# Patient Record
Sex: Male | Born: 1995 | Race: White
Health system: Southern US, Community
[De-identification: ages and names within clinical notes are randomized; demographics above are authoritative.]

## PROBLEM LIST (undated history)

## (undated) DIAGNOSIS — G919 Hydrocephalus, unspecified: Secondary | ICD-10-CM

## (undated) DIAGNOSIS — S060XAA Concussion with loss of consciousness status unknown, initial encounter: Secondary | ICD-10-CM

## (undated) DIAGNOSIS — D332 Benign neoplasm of brain, unspecified: Secondary | ICD-10-CM

## (undated) DIAGNOSIS — S060X9A Concussion with loss of consciousness of unspecified duration, initial encounter: Secondary | ICD-10-CM

## (undated) DIAGNOSIS — J302 Other seasonal allergic rhinitis: Secondary | ICD-10-CM

## (undated) HISTORY — DX: Concussion with loss of consciousness status unknown, initial encounter: S06.0XAA

## (undated) HISTORY — DX: Benign neoplasm of brain, unspecified: D33.2

## (undated) HISTORY — DX: Other seasonal allergic rhinitis: J30.2

## (undated) HISTORY — DX: Hydrocephalus, unspecified: G91.9

## (undated) HISTORY — DX: Concussion with loss of consciousness of unspecified duration, initial encounter: S06.0X9A

## (undated) HISTORY — PX: THIRD VENTRICULOSTOMY: SHX2497

## (undated) MED FILL — RIMEGEPANT 75 MG DISINTEGRATING TABLET: 75 mg | ORAL | 30 days supply | Qty: 16 | Fill #2

## (undated) MED FILL — OXYBUTYNIN CHLORIDE ER 5 MG TABLET,EXTENDED RELEASE 24 HR: 5 mg | ORAL | 30 days supply | Qty: 30 | Fill #0

---
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
## 2017-01-17 ENCOUNTER — Telehealth (INDEPENDENT_AMBULATORY_CARE_PROVIDER_SITE_OTHER): Payer: Self-pay

## 2017-01-17 NOTE — Telephone Encounter (Signed)
Patient should be evaluated in clinic before a note can be written.

## 2017-01-17 NOTE — Telephone Encounter (Signed)
Pt called requesting a doctor's note to state that he should have a single room in his dorm at [employer] next fall. [employer] social worker, Thurston Hole, notified and given pt's e-mail address, and message routed to Dr. Evaristo Bury. Let Arman know that we would begin to work on it and he would hear from [place] in the next few days.

## 2017-01-17 NOTE — Telephone Encounter (Signed)
After speaking with Dr. Evaristo Bury, I called pt to notify him to please come in to be seen if he is having frequent headaches to the point that it is requiring special accommodations, that he needs to come in to be evaluate. Unable to hear pt while on the phone, so this information was also sent via e-mail to the address provided in our previous phone call.

## 2017-01-28 ENCOUNTER — Telehealth (INDEPENDENT_AMBULATORY_CARE_PROVIDER_SITE_OTHER): Payer: Self-pay

## 2017-01-28 NOTE — Telephone Encounter (Signed)
[name] called again  In reference to getting a letter for his classes because he is getting headaches in school and he wants a single room.   He asked if social work could just write a note.   I reviewed the notes from his previous calls.  [name] gave no indication that he had received the message from [place].  I called [facility] back and left a message with the same information that [name] left regarding him needing to make an appointment to be evaluated by Dr. Evaristo Bury and the neuro onc team  Before a note can be written.

## 2017-01-31 ENCOUNTER — Telehealth (INDEPENDENT_AMBULATORY_CARE_PROVIDER_SITE_OTHER): Payer: Self-pay

## 2017-01-31 NOTE — Telephone Encounter (Signed)
Pt calling to be put in touch with a social worker to assist with a letter for school d/t increasing headaches and need for a private room. I informed him that it would require physician approval for social work to write that kind of note, and that he needed to be seen by Dr. Evaristo Bury or another provider before it could be written, as I had told him previously when we last spoke. As patient is away at school, he is unable to be seen here in our clinic. I encouraged him to see a provider locally, and reiterated that if he is having severe headaches that are requiring special assistance, he needs to be seen by a provider as soon as possible. Pt continued to request speaking directly with social work regarding this issue - I notified him that our social worker, Thurston Hole, was not in the office today, but that she would be notified. Verbalized understanding and no other requests at this time.    Message routed to [employer], SW.

## 2017-02-21 ENCOUNTER — Ambulatory Visit (INDEPENDENT_AMBULATORY_CARE_PROVIDER_SITE_OTHER): Payer: Self-pay | Admitting: Pediatric Hematology-Oncology

## 2017-06-14 ENCOUNTER — Telehealth (INDEPENDENT_AMBULATORY_CARE_PROVIDER_SITE_OTHER): Payer: Self-pay

## 2017-06-14 NOTE — Telephone Encounter (Signed)
Mom called for results of MRI done 2 days ago, Sunday. I do not see results in Epic. This message routed to Dr. Evaristo Bury and Francesca Jewett NP. I called mom back to let her know I received her message and forwarded it to the providers.

## 2017-06-16 ENCOUNTER — Telehealth (INDEPENDENT_AMBULATORY_CARE_PROVIDER_SITE_OTHER): Payer: Self-pay

## 2017-06-16 NOTE — Telephone Encounter (Signed)
Mom called again looking for MRI results from Sunday.  Dr. August Saucer printed out the report and Mom was informed that there were no changes.  Mom verbalized understanding.

## 2017-07-13 ENCOUNTER — Ambulatory Visit (INDEPENDENT_AMBULATORY_CARE_PROVIDER_SITE_OTHER): Payer: No Typology Code available for payment source | Admitting: Pediatric Hematology-Oncology

## 2017-07-13 VITALS — BP 137/82 | HR 92 | Temp 97.7°F | Resp 20 | Ht 68.5 in | Wt 165.1 lb

## 2017-07-13 DIAGNOSIS — D332 Benign neoplasm of brain, unspecified: Secondary | ICD-10-CM

## 2017-07-13 DIAGNOSIS — R51 Headache: Secondary | ICD-10-CM

## 2017-07-13 DIAGNOSIS — G8929 Other chronic pain: Secondary | ICD-10-CM

## 2017-07-13 DIAGNOSIS — M5441 Lumbago with sciatica, right side: Secondary | ICD-10-CM

## 2017-07-13 DIAGNOSIS — M5442 Lumbago with sciatica, left side: Secondary | ICD-10-CM

## 2017-07-13 NOTE — Patient Instructions (Signed)
Brain MRI in 1 year  Spine MRI ordered  Call with concerns    [facility] is pleased to inform you that you can now receive convenient appointment reminders through text messaging, in place of phone calls.     If you would like to participate, text PSV to the number [phone].    Prescription refills, questions about lab results or discussions of your child's care may be addressed during regular business hours, Monday-Friday 8:30-4:30.    Appointment hours: Monday-Friday 8:30AM-4:00PM.  Call if you are going to be late or need to cancel an appointment.    To make an appointment: Call [phone] and press option 3. If your child is sick and requires immediate attention, press option 2. Do not leave a message after hours on the sick line.    After hour Emergencies: : Call [phone]. The message states that the office is closed , then press option 6.The call is transferred to an [facility]. Please ask for the on-call Pediatric Heme/Onc physician. This operator will take your number and have the on-call doctor call back. If needed, the operator may be called directly at [phone].    For questions or concerns during office hours: Call [phone] and press option 4. Leave a message with the spelling of your child's name, date of birth and question.  All messages left after business hours will be answered the next business day. All calls are returned within 24 hours on business days only.     Any request for medication refills outside your child's scheduled appointment will require 48-business hours to be filled.

## 2017-07-14 ENCOUNTER — Encounter (INDEPENDENT_AMBULATORY_CARE_PROVIDER_SITE_OTHER): Payer: Self-pay | Admitting: Pediatric Hematology-Oncology

## 2017-07-14 DIAGNOSIS — M5441 Lumbago with sciatica, right side: Secondary | ICD-10-CM | POA: Insufficient documentation

## 2017-07-14 NOTE — Progress Notes (Signed)
[facility]   Hematology/Oncology Outpatient Return Visit    Date: 07/14/2017  Name: [name],[name]  Diagnosis: (presumptive) tectal glioma      Chief Complaint/Reason for Visit:     Chief Complaint   Patient presents with   . Follow-up         Subjective/Interval History:   Ediberto [name]) is a 21 y.o. 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    Since his last visit, [name] has had multiple medical issues including abdominal pain and cramping. He is being worked up by GI for possible gluten sensitivity.He underwent endoscopy last week. He has had significant low back pain. He has had back pain for multiple years, but it has worsened over the past 6 months. It is lower back, with numbness of his legs at times. Sometimes heat will help his back pain. He was seen by an orthopedist and plain films were negative. No bowel or bladder incontinence.   He did pass a small kidney stone a few weeks ago but the urologist did not feel that the back pain fit with that small of a stone.   He continues to have headaches a few times per week. They are no worse than previous.He usually drinks some caffeine and take ibuprofen for them. No vision changes. No seizures. He also feels that the headaches are worse when his allergies are bothering him.   He has been seen by ophthalmology in the last week and everything was fine.     He finished his freshman year at [employer] and did very well.     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 He previously had frequent daily headaches, and two severe headaches associated with emesis. Underwent endoscopic third ventriculostomy at [facility] on 12/27/12. Biopsy of the lesion was not performed at that time. He subsequently presented with recurrence of headaches without evidence of worsening hydrocephalus. Repeat imaging in April 2014 revealed  possible disease progression, prompting stereotactic biopsy on 04/09/13. Biopsy was non-diagnostic. Lesion has been stable on repeat imaging.    Review of Systems:   A complete review of systems was performed and is negative except for the items mentioned above.      Medications:     No outpatient prescriptions have been marked as taking for the 07/13/17 encounter (Office Visit) with Eddie North, MD.         Allergies:     Allergies   Allergen Reactions   . Peanut-Containing Drug Products    . Tree Nuts          Past Medical/Surgical/Family/Social History:   I personally reviewed and updated the past medical and past surgical histories.    Performance Status:     100% = Normal no complaints; no evidence of disease.    Physical Exam:     Vitals:    07/13/17 0830   BP: 137/82   Pulse: 92   Resp: 20   Temp: 97.7 F (36.5 C)   TempSrc: Oral   SpO2: 100%   Weight: 74.9 kg   Height: 1.74 m     General appearance - alert, well appearing, and in no distress  Eyes - pupils equal and reactive, extraocular eye movements intact  Ears - bilateral TM's and external ear canals normal  Nose - normal and patent, no erythema, discharge or polyps  Mouth - mucous membranes moist, pharynx normal  without lesions  Neck - supple, no significant adenopathy  Lymphatics - no palpable lymphadenopathy, no hepatosplenomegaly  Chest - clear to auscultation, no wheezes, rales or rhonchi, symmetric air entry  Heart - normal rate, regular rhythm, normal S1, S2, no murmurs, rubs, clicks or gallops  Abdomen - soft, nontender, nondistended, no masses or organomegaly, +BS  Musculoskeletal - moving all four extremities; no tenderness to palpation over his back   Extremities - peripheral pulses normal, cap refill <2 sec  Skin - normal coloration and turgor, no rashes, no suspicious skin lesions noted  Neurological - Cranial nerve II: visual fields intact  CN III, IV, VI: EOMI Pupils equal and reactive to light  CN V: facial sensation intact  CN  VII: Symmetric facies  CN VIII: Hearing intact  CN IX, X, XII: Tongue midline, uvula midline, normal articulation, normal swallow  CN XI: Shoulder shrug intact and symmetrical  Sensation: grossly intact   Reflexes: 2+ patellar   Gait: No ataxia  Normal Romberg   Strength: 5/5 B/L UE and LE       Labs:     Results     ** No results found for the last 24 hours. **           Radiology:     MRI brain reviewed and is stable.     Assessment:   Dillin [name]) is a 21 y.o. 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  Underwent stereotactic biopsy in April 2014, pathology results were indeterminate.     Stable headaches  New worsening back pain with some numbness of legs - concerning for spinal cord/nerve compression/irritation.   Most recent MRI shows continued stable disease with no evidence of hydrocephalus   Elevated BP -     Plan:   Reviewed MRI scan - no changes to brain MRI  Recommended MRI spine - ordered   Discussed management of headaches  Recommended having BP checked at other offices to ensure that it is WNL    MRI brain in 1 year with follow-up    Eddie North, MD        Time spent: I personally spent 30 minutes with [name] and his father, greater than 50% of which was spent in counseling and/or coordination of care discussing back pain, MRI review, recommended follow-up.

## 2017-07-25 ENCOUNTER — Ambulatory Visit: Payer: No Typology Code available for payment source | Attending: Pediatric Hematology-Oncology

## 2017-07-25 DIAGNOSIS — M5441 Lumbago with sciatica, right side: Secondary | ICD-10-CM | POA: Insufficient documentation

## 2017-07-25 DIAGNOSIS — M5442 Lumbago with sciatica, left side: Secondary | ICD-10-CM | POA: Insufficient documentation

## 2017-07-25 DIAGNOSIS — R200 Anesthesia of skin: Secondary | ICD-10-CM | POA: Insufficient documentation

## 2017-07-25 DIAGNOSIS — M519 Unspecified thoracic, thoracolumbar and lumbosacral intervertebral disc disorder: Secondary | ICD-10-CM | POA: Insufficient documentation

## 2017-07-25 DIAGNOSIS — G919 Hydrocephalus, unspecified: Secondary | ICD-10-CM | POA: Insufficient documentation

## 2017-07-25 MED ORDER — GADOBUTROL 1 MMOL/ML IV SOLN
7.5000 mL | Freq: Once | INTRAVENOUS | Status: AC | PRN
Start: 2017-07-25 — End: 2017-07-25
  Administered 2017-07-25: 19:00:00 7.5 mmol via INTRAVENOUS
  Filled 2017-07-25: qty 7.5

## 2017-07-26 ENCOUNTER — Telehealth (INDEPENDENT_AMBULATORY_CARE_PROVIDER_SITE_OTHER): Payer: Self-pay

## 2017-07-26 NOTE — Telephone Encounter (Signed)
Please let [name] know that MRI showed no evidence of tumor in his spine however it did show stable mild disk disease which is likely causing his back pain. Recommend eval by PT

## 2017-07-26 NOTE — Telephone Encounter (Signed)
Per Mckinley Jewel, NP request, RN called and notified mother that MRI showed no evidence of tumor in [name]'s spine and that the only thing of note was mild disc disease which is likely causing his back pain. RN informed mother that Mckinley Jewel, NP recommends evaluation by PT. Mother verbalized understanding.

## 2017-07-26 NOTE — Telephone Encounter (Signed)
An left a Engineer, technical sales on the nursing phone line. He reports that Dr. August Saucer asked him to call the clinic after he got his MRI of the spine. Patient calling to report that he got the MRI of his spine yesterday at [facility]. Patient stated no need to call back, just wanted to let Dr. August Saucer know as she instructed.    Message routed to Dr. Norva Riffle & Mckinley Jewel, NP.

## 2017-10-12 ENCOUNTER — Telehealth (INDEPENDENT_AMBULATORY_CARE_PROVIDER_SITE_OTHER): Payer: Self-pay

## 2017-10-12 NOTE — Telephone Encounter (Signed)
Pt called for recommendation for neurologist in [place] d/t recent headaches. 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 Per Dr. August Saucer, pt should get a PCP in the area, who can then refer him to a neurologist. I called pt back and let him know, and he verbalized understanding. I also suggested he check with the on-campus clinic for evaluation and a referral.

## 2017-10-17 ENCOUNTER — Telehealth (INDEPENDENT_AMBULATORY_CARE_PROVIDER_SITE_OTHER): Payer: Self-pay

## 2017-10-17 NOTE — Telephone Encounter (Signed)
[name] called and left message requesting name of neurologist to evaluate him for headaches. Spoke with [name] and reiterated per conversation last week with RN that he should go ot his PCP for referral. Explained that PCP does have neurologists but that PCP may have specific recommendations. [name] verbalized understanding to plan.

## 2017-11-08 ENCOUNTER — Encounter (INDEPENDENT_AMBULATORY_CARE_PROVIDER_SITE_OTHER): Payer: Self-pay

## 2018-06-28 ENCOUNTER — Ambulatory Visit: Payer: No Typology Code available for payment source | Admitting: Pediatric Hematology-Oncology

## 2018-06-28 VITALS — BP 116/70 | HR 56 | Temp 98.2°F | Resp 18 | Ht 68.94 in | Wt 168.0 lb

## 2018-06-28 DIAGNOSIS — D496 Neoplasm of unspecified behavior of brain: Secondary | ICD-10-CM

## 2018-06-28 DIAGNOSIS — G4489 Other headache syndrome: Secondary | ICD-10-CM

## 2018-06-28 NOTE — Patient Instructions (Signed)
Next MRI in December  Ophthalmology     Call with concerns    [facility] is pleased to inform you that you can now receive convenient appointment reminders through text messaging, in place of phone calls.     If you would like to participate, text PSV to the number [phone].    Prescription refills, questions about lab results or discussions of your child's care may be addressed during regular business hours, Monday-Friday 8:30-4:30.    Please note our new phone number and address:  [address]  [place], [place] [address]  Phone: [phone]  Fax: [phone]    Appointment hours: Monday-Friday 8:30AM-4:00PM.  Call if you are going to be late or need to cancel an appointment.    To make an appointment: Call [phone] and press option 3. If your child is sick and requires immediate attention, press option 2. Do not leave a message after hours on the sick line.    After hour Emergencies: : Call [phone]. The message states that the office is closed , then press option 6.The call is transferred to an [facility]. Please ask for the on-call Pediatric Heme/Onc physician. This operator will take your number and have the on-call doctor call back. If needed, the operator may be called directly at [phone].    For questions or concerns during office hours: Call [phone] message with the spelling of your child's name, date of birth and question.  All messages left after business hours will be answered the next business day. All calls are returned within 24 hours on business days only.     Any request for medication refills outside your child's scheduled appointment will require 48-business hours to be filled.

## 2018-06-28 NOTE — Progress Notes (Signed)
[facility]   Hematology/Oncology Outpatient Return Visit    Date: 07/04/2018  Name: [name],[name]  Diagnosis: (presumptive) tectal glioma      Chief Complaint/Reason for Visit:     Chief Complaint   Patient presents with   . Follow-up         Subjective/Interval History:   [name]) is a 22 y.o. 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    Since his last visit, [name] has had multiple medical issues with severe headaches which necessitated him not completing his fall semester at [employer]. He was having severe daily headaches. He was seen by a neurologist at [facility]. An MRI was stable. He was started on Propranolol and his headaches have improved.   He enrolled at [employer] for the spring semester and plans to return to [employer] in the fall.     Diagnostic/Treatment history:   Treatment History  Diagnosis: tectal glioma (dx 12/24/2012)  Initial Presentation: frequent daily headaches, and two severe headaches associated with emesis.  Site of Disease: tectal lesion   Surgery: Endoscopic third ventriculostomy at [facility] on 12/27/12.  Stereotactic biopsy at [facility] on 04/09/13 after concern for disease progression  Biopsy was non-diagnostic    Radiation Therapy: none   Chemotherapy: none  Central Line: none  Shunts: none  Enteral Tubes: none     Neurocognitive testing: none    Review of Systems:   A complete review of systems was performed and is negative except for the items mentioned above.      Medications:     Outpatient Prescriptions Marked as Taking for the 06/28/18 encounter (Office Visit) with Eddie North, MD   Medication Sig Dispense Refill   . propranolol (INDERAL LA) 120 MG 24 hr capsule Take 120 mg by mouth daily           Allergies:     Allergies   Allergen Reactions   . Peanut-Containing Drug Products    . Tree Nuts          Past Medical/Surgical/Family/Social History:   I personally reviewed and updated the past medical and  past surgical histories.    Performance Status:     100% = Normal no complaints; no evidence of disease.    Physical Exam:     Vitals:    06/28/18 0836   BP: 116/70   Pulse: (!) 56   Resp: 18   Temp: 98.2 F (36.8 C)   TempSrc: Oral   SpO2: 99%   Weight: 76.2 kg   Height: 1.751 m     General appearance - alert, well appearing, and in no distress  Eyes - pupils equal and reactive, extraocular eye movements intact  Ears - bilateral TM's and external ear canals normal  Nose - normal and patent, no erythema, discharge or polyps  Mouth - mucous membranes moist, pharynx normal without lesions  Neck - supple, no significant adenopathy  Lymphatics - no palpable lymphadenopathy, no hepatosplenomegaly  Chest - clear to auscultation, no wheezes, rales or rhonchi, symmetric air entry  Heart - normal rate, regular rhythm, normal S1, S2, no murmurs, rubs, clicks or gallops  Abdomen - soft, nontender, nondistended, no masses or organomegaly, +BS  Musculoskeletal - moving all four extremities; no tenderness to palpation over his back   Extremities - peripheral pulses normal, cap refill <2 sec  Skin - normal coloration and turgor, no rashes, no suspicious skin lesions noted  Neurological - Cranial  nerve II: visual fields intact  CN III, IV, VI: EOMI Pupils equal and reactive to light  CN V: facial sensation intact  CN VII: Symmetric facies  CN VIII: Hearing intact  CN IX, X, XII: Tongue midline, uvula midline, normal articulation, normal swallow  CN XI: Shoulder shrug intact and symmetrical  Sensation: grossly intact   Reflexes: 2+ patellar   Gait: No ataxia  Normal Romberg   Strength: 5/5 B/L UE and LE       Labs:     Results     ** No results found for the last 24 hours. **           Radiology:     MRI brain reviewed and is stable.     Assessment:   [name]) is a 22 y.o. 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  Underwent stereotactic biopsy in April 2014, pathology results were indeterminate.     Improving headaches on  propranolol  Back pain has mostly resolved  Most recent MRI shows continued stable disease with no evidence of hydrocephalus       Plan:   Reviewed MRI scan - no changes to brain MRI  Repeat brain MRI in December    Discussed management of headaches  Recommended follow-up with ophthalmology  Recommended having BP checked at other offices to ensure that it is WNL    MRI brain in 1 year with follow-up    Eddie North, MD        Time spent: I personally spent 30 minutes with [name] greater than 50% of which was spent in counseling and/or coordination of care discussing headaches,  MRI review, recommended follow-up.

## 2018-06-29 ENCOUNTER — Telehealth: Payer: Self-pay

## 2018-07-04 ENCOUNTER — Encounter: Payer: Self-pay | Admitting: Pediatric Hematology-Oncology

## 2018-07-26 ENCOUNTER — Telehealth: Payer: Self-pay

## 2018-07-26 ENCOUNTER — Encounter: Payer: Self-pay | Admitting: Pediatric Hematology-Oncology

## 2018-07-26 NOTE — Telephone Encounter (Signed)
[name] called the nursing phone line and sent the following patient advice request:    "Dear Dr. August Saucer,     I hope you are doing well. Since I last saw you, I have had a pretty constant headache along with ear pain. I saw my neurologist, Dr. Robley Fries, on Friday and she explained it is most likely a migraine. I am a little concerned because after I saw her on Saturday, I had an episode that is similar to what I had when I had hydrocephalus. The episode consisted of a severe headache, nausea, vomiting, and painful vision. Is it possible that the ETV would close up since November (the last time I had an MRI)? If so, should I move up my MRI so we can make sure it has not close up? Please let me know.     Best,   [name] "    Per [name], he already discussed his new symptoms with his neurologist and they believe it is migraines. [name] is very concerned and thinks these symptoms feel like hydrocephalus. He would like to have an MRI prior to going back to school in 2 weeks instead of waiting until December. RN discussed with Doc of the Day, Dr. Mickey Farber. Per Dr. Threasa Beards, ok to schedule MRI in the next two weeks. RN discussed with Brion Aliment, and [place] to schedule and notify patient. RN called patient back and notified him that he will be hearing from [place] regarding scheduling a scan. Patient verbalized understanding.      Routed to Dr. Norva Riffle & Willeen Niece, RN

## 2018-08-02 ENCOUNTER — Ambulatory Visit: Payer: No Typology Code available for payment source

## 2018-08-03 ENCOUNTER — Ambulatory Visit: Payer: No Typology Code available for payment source | Attending: Pediatric Hematology-Oncology

## 2018-08-03 DIAGNOSIS — D496 Neoplasm of unspecified behavior of brain: Secondary | ICD-10-CM | POA: Insufficient documentation

## 2018-08-03 DIAGNOSIS — G939 Disorder of brain, unspecified: Secondary | ICD-10-CM | POA: Insufficient documentation

## 2018-08-03 MED ORDER — GADOBUTROL 1 MMOL/ML IV SOLN
8.0000 mL | Freq: Once | INTRAVENOUS | Status: AC | PRN
Start: 2018-08-03 — End: 2018-08-03
  Administered 2018-08-03: 22:00:00 8 mmol via INTRAVENOUS
  Filled 2018-08-03: qty 10

## 2018-08-07 ENCOUNTER — Telehealth: Payer: Self-pay

## 2018-08-07 NOTE — Telephone Encounter (Signed)
Mother called, left message  requesting results of MRI done 08/03/18.   Results available - Impression:  No significant interval change. Unchanged expansile T2 and FLAIR  hyperintense lesion involving the right superior colliculus with unchanged  6 mm focus of enhancement.    Returned call to mother and informed her that MRI is stable. Mother indicates understanding.  Glynn Octave RN

## 2018-11-24 ENCOUNTER — Other Ambulatory Visit: Payer: Self-pay | Admitting: Pediatric Hematology-Oncology

## 2018-11-24 DIAGNOSIS — D496 Neoplasm of unspecified behavior of brain: Secondary | ICD-10-CM

## 2019-01-23 IMAGING — US US Testicular
1 series · 13 of 16 positions shown · non-contrast
Comparison: None.

US Testicular
INDICATION: Swelling.                                                                    
 Pertinent History: Patient states that for the past 3 months he has had a                 
 frequent urge to urinate but is unable to go all of the time. Within the last             
 week he has noticed discomfort within his left testicle.
TECHNIQUE: Ultrasound of the scrotum including grayscale, color Doppler, and              
 spectral evaluation of the testicles and epididymides was performed.                      
 Comments: None.                                                                           
 Limitations: None.

[Series 1: us testicular · 13 of 54 slices shown]
[im 1/54]
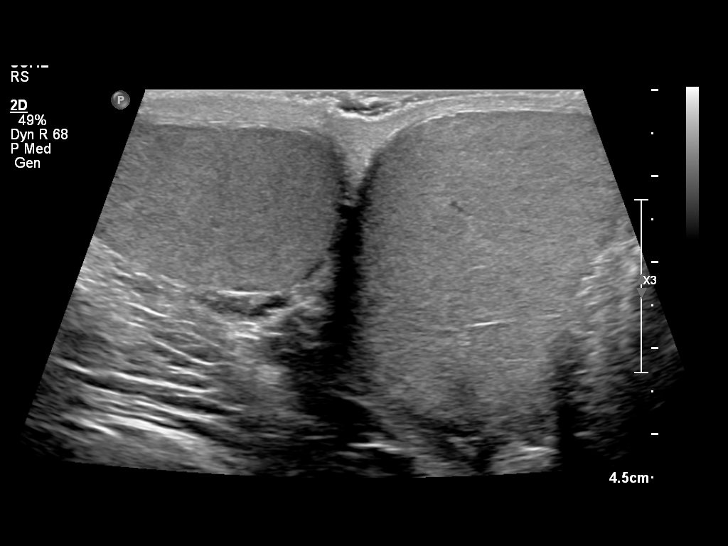
[im 4/54]
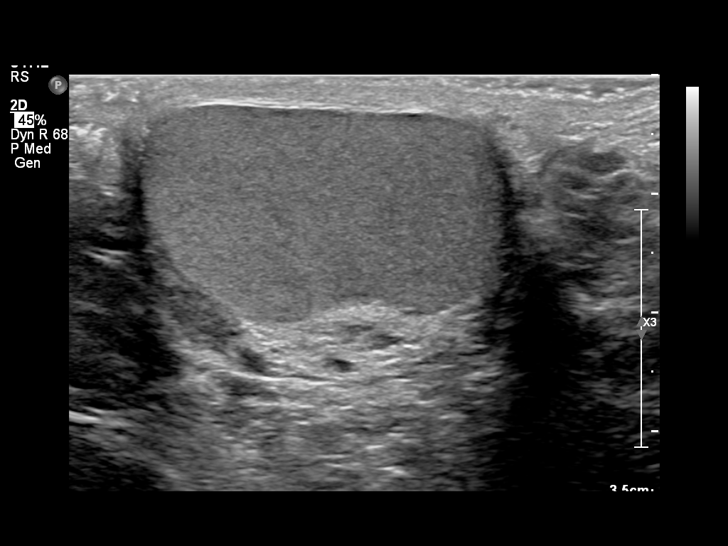
[im 11/54]
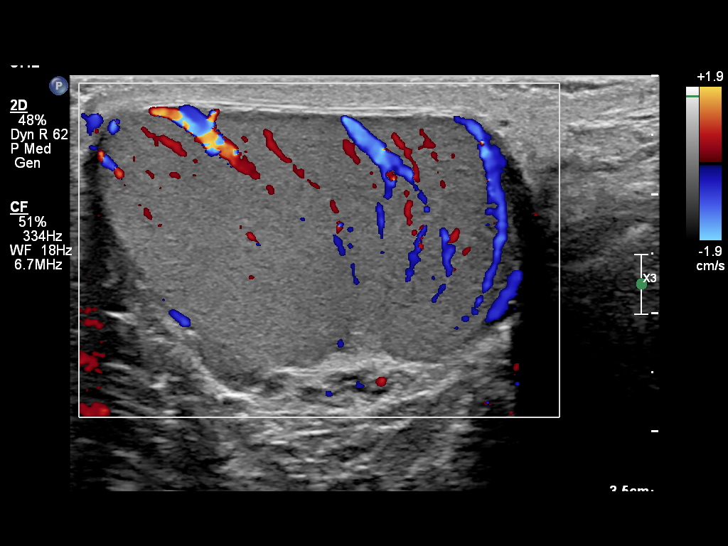
[im 15/54]
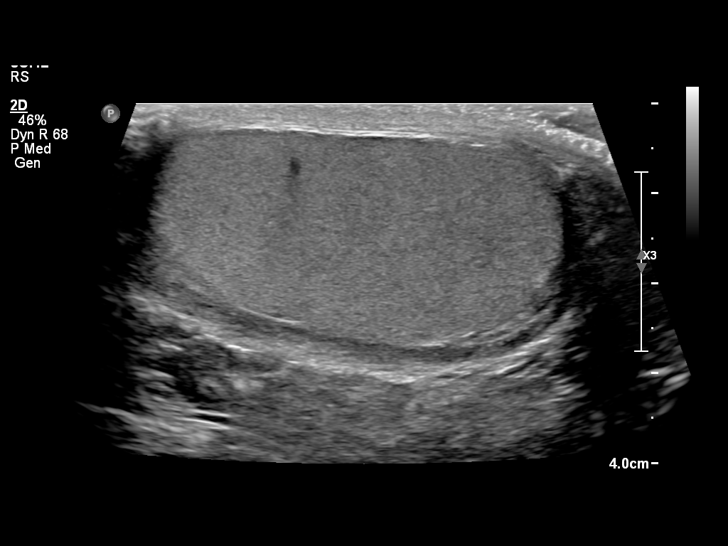
[im 18/54]
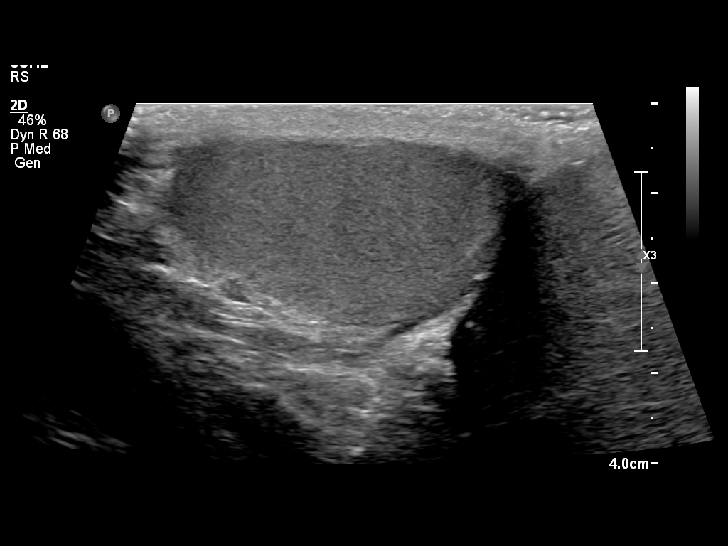
[im 22/54]
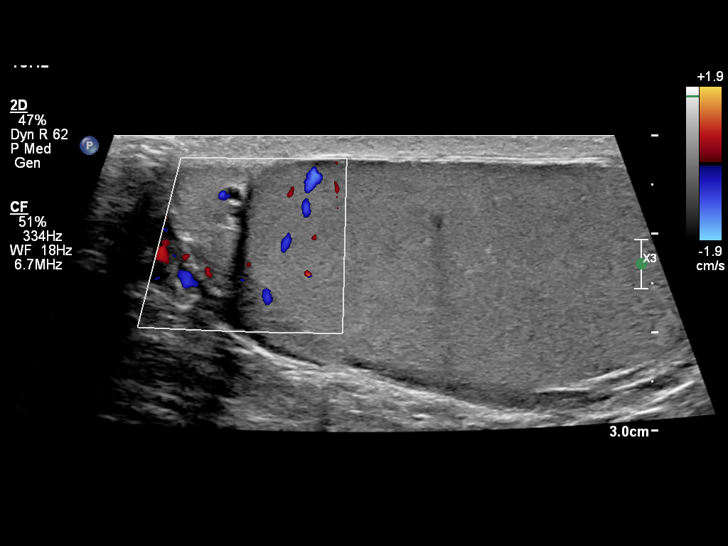
[im 29/54]
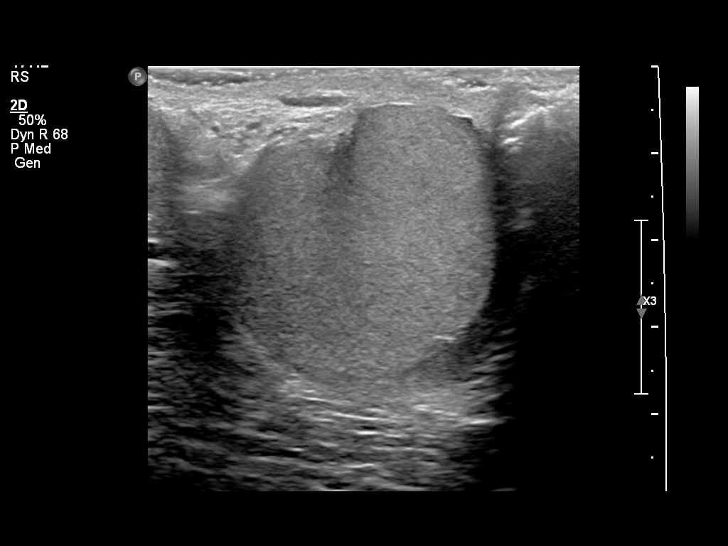
[im 32/54]
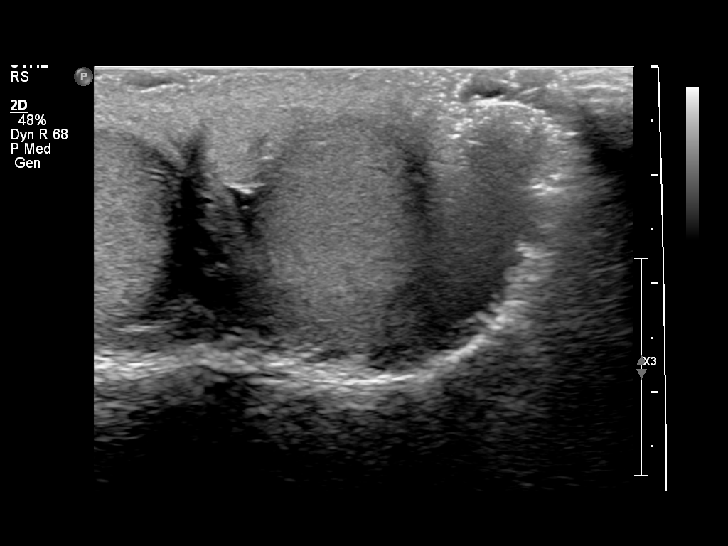
[im 36/54]
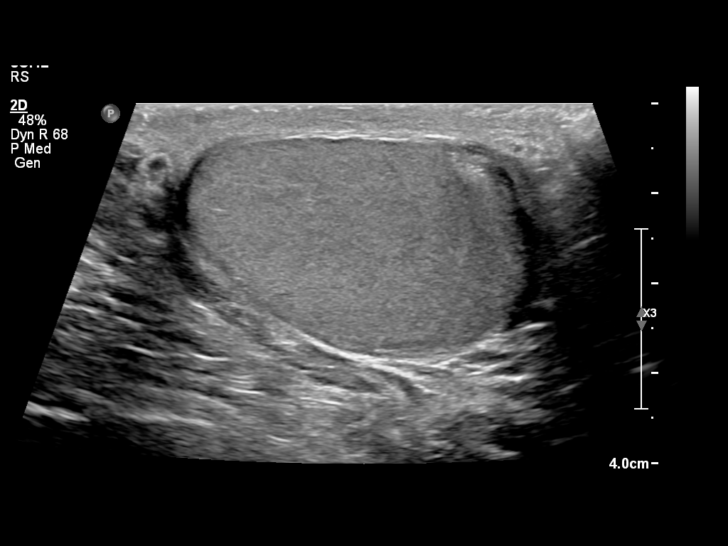
[im 39/54]
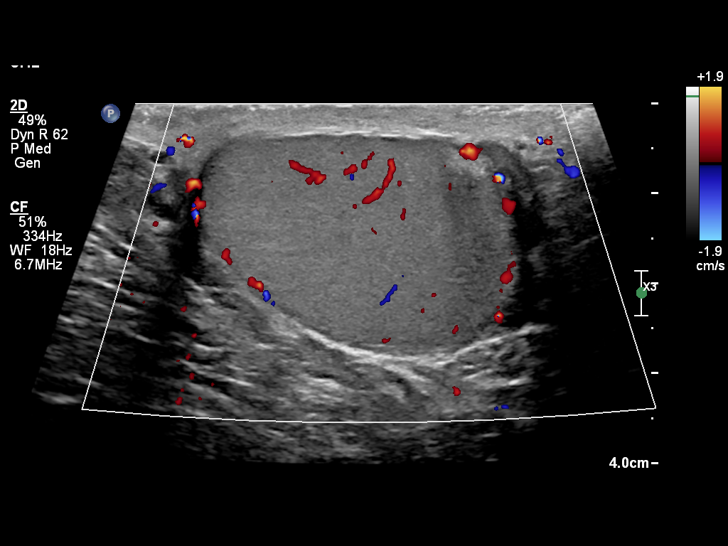
[im 43/54]
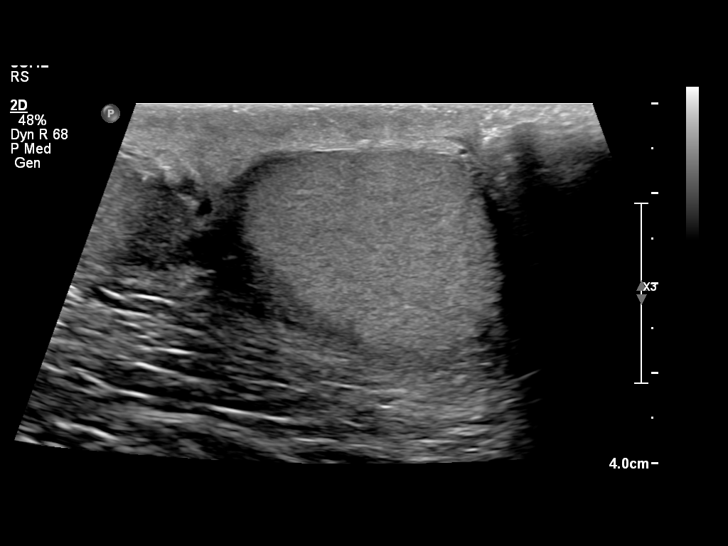
[im 50/54]
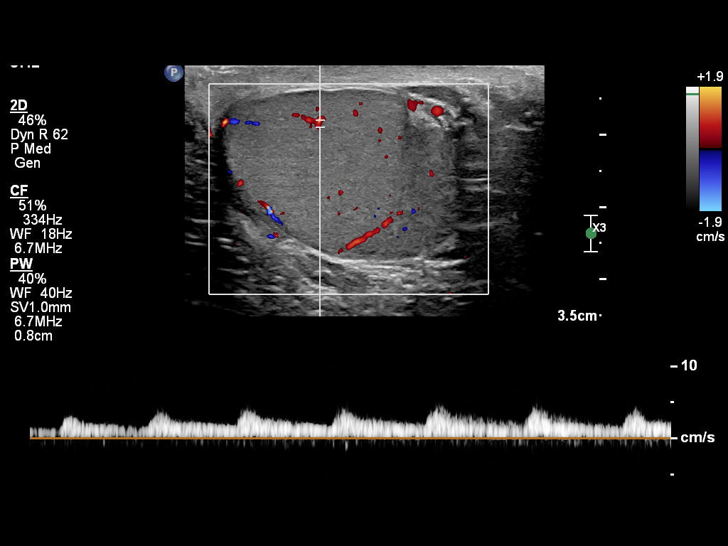
[im 54/54]
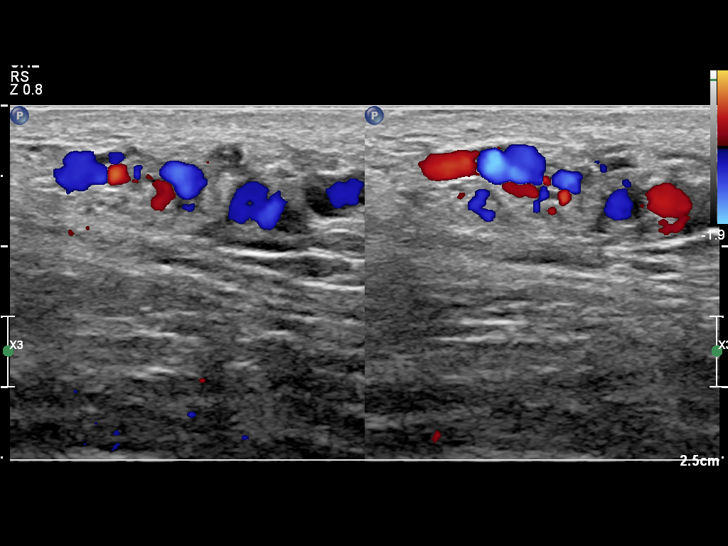

[13 of 16 positions shown; findings below may reference images not displayed]

FINDINGS: RIGHT                                                                                     
 Testicle size: 4.9 x 2.4 x 3.4cm.                                                         
 Appearance: Normal appearance without focal lesions.                                      
 Doppler: Normal spectral and color Doppler arterial inflow and venous                     
 outflow.                                                                                  
 Epididymis: Normal.                                                                       
 Hydrocele: None.                                                                          
 Varicocele: Absent.                                                                       
 LEFT                                                                                      
 Testicle size: 3.9 x 2.5 x 3.0 cm.                                                        
 Appearance: Normal appearance without focal lesions.                                      
 Doppler: Normal spectral and color Doppler arterial inflow and venous                     
 outflow.                                                                                  
 Epididymis: Normal.                                                                       
 Hydrocele: None.                                                                          
 Varicocele: Absent.                                                                       
 Additional: Grossly normal appearance of the scrotum.
IMPRESSION: 1.  . Unremarkable testicular ultrasound.

## 2019-03-29 ENCOUNTER — Other Ambulatory Visit: Payer: Self-pay | Admitting: Pediatrics

## 2019-03-29 DIAGNOSIS — D496 Neoplasm of unspecified behavior of brain: Secondary | ICD-10-CM

## 2019-05-16 ENCOUNTER — Other Ambulatory Visit: Payer: Self-pay | Admitting: Internal Medicine

## 2019-06-13 ENCOUNTER — Telehealth: Payer: Self-pay

## 2019-06-13 NOTE — Telephone Encounter (Signed)
Called patient because he needs to get tested for covid prior to his scans on 7/7. LVM to call back on vonage.

## 2019-06-14 ENCOUNTER — Telehealth: Payer: Self-pay

## 2019-06-14 NOTE — Telephone Encounter (Signed)
Patient returned phone call made earlier this week. He will be tested for covid on 7/5 at [facility] in [place] for his surgery scheduled on 7/9 with ENT. Patient would like to move scans to another day so that he could have time to get results from urgent care. Spoke to [employer] and covid list that was e-mail was incorrect patient scans are not until 8/10 with [facility]. Patient was then contacted again to inform them about the new infomation. Pt will not be able to make it to apt because he will be at school patient will contact [facility] himself to reschedule apt and will call us as soon as he has a date so that we can schedule a follow up with the neuro onc clinic.Becuase pt is 23 yrs old and is not being sedated he will not need to get tested for covid unless he is symptomatic or directed by [facility].

## 2019-08-15 ENCOUNTER — Ambulatory Visit (INDEPENDENT_AMBULATORY_CARE_PROVIDER_SITE_OTHER): Payer: No Typology Code available for payment source | Admitting: Pediatric Hematology-Oncology

## 2019-08-15 ENCOUNTER — Encounter: Payer: Self-pay | Admitting: Pediatric Hematology-Oncology

## 2019-08-15 VITALS — BP 117/78 | HR 77 | Temp 98.2°F | Resp 18 | Ht 68.82 in | Wt 172.6 lb

## 2019-08-15 DIAGNOSIS — G8929 Other chronic pain: Secondary | ICD-10-CM

## 2019-08-15 DIAGNOSIS — D331 Benign neoplasm of brain, infratentorial: Secondary | ICD-10-CM

## 2019-08-15 DIAGNOSIS — R51 Headache: Secondary | ICD-10-CM

## 2019-08-15 NOTE — Patient Instructions (Signed)
Recommend yearly MRI  Call with concerns    Neuro-Oncology Team  Physician - Norva Riffle, MD  Oncology Nurse Practitioner - Lanell Matar, NP   Nurse Coordinator - Willeen Niece, RN    For urgent medical questions/needs - call [phone] and press option #2  For after hours urgent medical needs  - call [phone]. The message states that the office is closed, then press option 6.The call is transferred to an [facility]. Please ask for the on-call Pediatric Heme/Onc physician. This operator will take your number and have the on-call doctor call back. If needed, the operator may be called directly at [phone].    For Appointments - Please call the main number [phone] and press option #3    For scan scheduling and insurance questions  - please call Brion Aliment at [phone] or email at [email or web address]    For scan results - please allow 24-48 hours for results. If you have not heard from [place] then please call [phone] and leave a message with your child's name, DOB, and date/location of the scan.     For refills - please request at the time of your appointment. Outside of that time please call [phone] and leave a message or email [email or web address]. Please allow 48 hours for refills requested outside of a clinic appointment.     Our medical team is busy seeing patients through the day and therefore we do not check email frequently. Urgent needs should be communicated via phone.  We encourage you to write down your non-urgent questions to be addressed at your next clinic visit.

## 2019-08-16 NOTE — Progress Notes (Signed)
[facility]   Hematology/Oncology Outpatient Return Visit    Date: 08/16/2019  Name: Bezek,[name]  Diagnosis: (presumptive) tectal glioma      Chief Complaint/Reason for Visit:     Chief Complaint   Patient presents with   . Follow-up         Subjective/Interval History:   Melbert [name]) is a 23 y.o. 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      Since his last visit Gilberto continues to struggle with migraines which he thinks are different than his headaches when initially diagnosed. He came off of propranolol and tried botox which helped for awhile but is now wearing off. He has an appointment with his neurologist for repeat botox and to discuss other medication management.     He has returned to [facility]. He is living in an apartment. 3 of his roommates tested positive for coronavirus. He tested negative ( on 2 separate occasions). He has 2 1/2 semesters left of school and wants to get a JD/MBA degree.     He has been getting regular exercise. MRI done in [facility] has measurements similar to previous MRI but we have not seen the images yet.     Diagnostic/Treatment history:   Treatment History  Diagnosis: tectal glioma (dx 12/24/2012)  Initial Presentation: frequent daily headaches, and two severe headaches associated with emesis.  Site of Disease: tectal lesion   Surgery: Endoscopic third ventriculostomy at [facility] on 12/27/12.  Stereotactic biopsy at [facility] on 04/09/13 after concern for disease progression  Biopsy was non-diagnostic    Radiation Therapy: none   Chemotherapy: none  Central Line: none  Shunts: none  Enteral Tubes: none     Neurocognitive testing: none    Review of Systems:   A complete review of systems was performed and is negative except for the items mentioned above.      Medications:     Outpatient Medications Marked as Taking for the 08/15/19 encounter (Office Visit) with Eddie North, MD   Medication Sig Dispense Refill    . cetirizine (ZYRTEC) 10 MG tablet Take 10 mg by mouth daily.     . montelukast (SINGULAIR) 10 MG tablet Take 10 mg by mouth nightly.           Allergies:     Allergies   Allergen Reactions   . Peanut-Containing Drug Products    . Tree Nuts          Past Medical/Surgical/Family/Social History:   I personally reviewed and updated the past medical and past surgical histories.    Performance Status:     100% = Normal no complaints; no evidence of disease.    Physical Exam:     Vitals:    08/15/19 1132   BP: 117/78   Pulse: 77   Resp: 18   Temp: 98.2 F (36.8 C)   TempSrc: Oral   SpO2: 98%   Weight: 78.3 kg   Height: 1.748 m     General appearance - alert, well appearing, and in no distress  Eyes - pupils equal and reactive, extraocular eye movements intact  Ears - bilateral TM's and external ear canals normal  Nose - normal and patent, no erythema, discharge or polyps  Mouth - mucous membranes moist, pharynx normal without lesions  Neck - supple, no significant adenopathy  Lymphatics - no palpable lymphadenopathy, no hepatosplenomegaly  Chest - clear to auscultation, no wheezes, rales or rhonchi, symmetric  air entry  Heart - normal rate, regular rhythm, normal S1, S2, no murmurs, rubs, clicks or gallops  Abdomen - soft, nontender, nondistended, no masses or organomegaly, +BS  Musculoskeletal - moving all four extremities; no tenderness to palpation over his back   Extremities - peripheral pulses normal, cap refill <2 sec  Skin - normal coloration and turgor, no rashes, no suspicious skin lesions noted  Neurological - Cranial nerve II: visual fields intact  CN III, IV, VI: EOMI Pupils equal and reactive to light  CN V: facial sensation intact  CN VII: Symmetric facies  CN VIII: Hearing intact  CN IX, X, XII: Tongue midline, uvula midline, normal articulation, normal swallow  CN XI: Shoulder shrug intact and symmetrical  Sensation: grossly intact   Reflexes: 2+ patellar   Gait: No ataxia  Normal Romberg   Strength:  5/5 B/L UE and LE       Labs:     Results     ** No results found for the last 24 hours. **           Radiology:     MRI brain report reviewed and is stable.     Assessment:   Marcoantonio [name]) is a 23 y.o. 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  Underwent stereotactic biopsy in April 2014, pathology results were indeterminate.     Chronic migraines - being managed by neurologist  Back pain - resolved      Plan:   Will obtain images in order for myself and Dr. Raylene Miyamoto to review.  If stable would recommend annual MRI   We discussed transition to an adult neurosurgeon for his ongoing care. I do not think at this time he needs to be transitioned to an adult oncologist.   Certainly if there is concern for tumor growth and need for intervention an oncologist could be consulted at that time.     Ahmad is in agreement with the plan. Once he establishes care we can forward our records.    Continued follow-up with neurology for headache management.   Yearly ophthalmology visits.     Eddie North, MD        Time spent: I personally spent 30 minutes with Jonny Ruiz greater than 50% of which was spent in counseling and/or coordination of care discussing headaches,  transition, recommended follow-up.

## 2020-03-27 ENCOUNTER — Ambulatory Visit: Admit: 2020-07-18 | Payer: PRIVATE HEALTH INSURANCE | Attending: Physician | Primary: Physician

## 2020-04-07 ENCOUNTER — Other Ambulatory Visit (INDEPENDENT_AMBULATORY_CARE_PROVIDER_SITE_OTHER): Payer: Self-pay | Admitting: Pediatric Infectious Disease

## 2020-04-17 MED ORDER — ALBUTEROL SULFATE HFA 90 MCG/ACTUATION AEROSOL INHALER: 90 mcg/actuation | RESPIRATORY_TRACT | Status: AC | PRN

## 2020-04-17 MED ORDER — SUMATRIPTAN 50 MG TABLET: 50 mg | ORAL | Status: DC | PRN

## 2020-04-22 ENCOUNTER — Ambulatory Visit: Admit: 2020-04-24 | Discharge: 2020-05-09 | Payer: PRIVATE HEALTH INSURANCE | Attending: Neurology | Primary: Physician

## 2020-04-22 DIAGNOSIS — D496 Neoplasm of unspecified behavior of brain: Secondary | ICD-10-CM

## 2020-04-22 DIAGNOSIS — R002 Palpitations: Secondary | ICD-10-CM

## 2020-04-22 NOTE — Progress Notes (Signed)
Thank you for referring Ralph Allen who I saw today 04/22/20 at the [facility] in consultation for headache.     Patient consents to being seen in her home via teleconferencing? yes   By consenting, the patient understands that they are responsible for their privacy surrounding themand [facility] guarantees that this visit complies with all HIPPA standards. I performed this consultation using real-time telehealth tools, including a live video connection between my location (Office/Clinic) and the patient's location. Prior to initiating the consultation, I obtained the patient's informed verbal consent to perform this consultation using telehealth tools and answered all the questions the patient had about the telehealth interaction.   Appointment was scheduled by telemedicine due to Covid 19 pandemic.     Patient is in [place], his Mom is in [place], he is completing college Secretary/administrator) and moving to [place] area to [employer], [employer], [place]. He has headache specialist in [place] and really improved on Botox for chronic migraine. This appt was scheduled for transfer of care so he can receive Botox in 3 months from previous per FDA approved PREEMPT protocol - will be due in Aug 2021, per patients report.    Before Botox he had 15 or more days with significant headache per month, which lasted for longer than 4 hours, longer than 4 months.     Botox - improved after by more than 100 hours a months, will ask to schedule for chr migraine in Aug 2021     Daril Joesph Marcy is a 24 y.o.  male who has a history of brain tumor, monitored, per patient - had 2 attempts for biopsy with [place] neurosurgery in 2014, stable, doing MRI brain every year. 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, serial yearly MRIs with neurosurgery     Takes Ibuprofen every other day - reported 04/23/2020 We discussed [facility]     2014, and then - "lots of headache" in college     Oct 2014 - took med leave fom school, completing school in 2021     With Botox - decreased from 20 days to 10 days of intensive headaches a month   2 a month would be very severe - nurtec is helpful PRN "Fantastic" (took in the past about 6 times, over few months "since summer")     Headaches after Botox started "don't last days", 2-6 hours usually     The patient had 15 days with no headaches last month. The patient had 10 days with high intensity headaches last month, (from them may be 2 days with very intensive headache - took Nurtec prn, helpful) and on the remaining 5 days of the month the patient reports a mild headache intensity (Takes Ibu every other day)   Not in am     mid afternoon normaly more headache     After Covid vaccine p - had more headache in am which is more unusual     Headache location:   BL   Change in stress levels - triggers   After big exam  Headache is :   Throbbing: yes, BL, both ears involved   Pressure-like: y     Light  sensitivity   "discomfort seeing "  Had blurry vision  "Unfocused"     Lots of cervicalgia, reports "stress-related"     Golf helps during headache (distracts)     Chiary 1, walking golf is helping     Headache increases as the day progresses: 2 hours usually lasts (after Botox was started)     Associated symptoms include:   Photophobia: y  Phonophobia: y  Osmophobia: y, more in the past   Nausea: rare, but with headache after Covid p vaccine had nausea   Movement sensitivity:  golf helps during headache     2 a month - "nausea , disosriented feeling"     Aura/Neuro changes:   Visual changes: blurry   Paresthesia:  numbness in the" face both sides , cheaks, head area numb and inflamed"   Muscle weakness:  n  Cognitive impairment: y   Vertigo: n  Lightheadedness: occasionally   Tinnitus: occasionally, ringing    Pressure in ears     Allodynia is present.     Cranial autonyomic symptoms:   Conjunctival injection: n  Periorbital edema: n  Ptosis: has rt ptosis on exam   Lacrimation: yes, "eyes water" a lot   Agitation: "not often"     Premonitory symptoms:   Neck pain - yes   Micturition changes: retention, not connected to headache in time, planning to follow up with urologist   Bowel movement changes: n    Triggers include:   Stress and relaxation from stress   Undersleeping: y      Past medical history:   Had covid end of 2020 - moderate sms, no intubation (Oct-Nov 2020), palpitations since that time, developed "spikes in BP" in 2021 - needs to follow up with cardiology, PCP   brain tumor, monitored, per patient - had 2 attempts for biopsy with [place] neurosurgery in 2014, stable, doing MRI brain every year. 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     Current medications are as follows:         Current Medications        Dosage    albuterol 90 mcg/actuation metered dose inhaler Inhale 2 puffs into the lungs daily as needed    SUMAtriptan (IMITREX) 50 mg tablet Take 50 mg by mouth daily as needed          nurtec prn - 2  /months, very helpful   botox - will be dur in Aug 2021   zirtec   nasacort   advil - onec every other day - discussed effects, side effects       Previously tried preventive therapies:  Melatonin prn sleep   Amitriptyline: No   Nortriptyline: Yes , after 2014 brain tumor surgeries, SE  Topamax: No   Gabapentin: Yes , headaches came back after 9 months on that med, no SE (initially helpful)  Valproic acid: No   Verapamil: No   Flunarizine: No   Propranolol: Yes   Metoprolol: No   Lamotrigine: No   Amlodipine: No   Memantine: No   Cyproheptadine: No   Vit B2: No   Coenzime Q10: No   Candesartan: No   GON block: No   Botox: Yes - next in may 2021 #5th treatment   Neuromudulation devices n    Previously tried acute therapies:   nurtec   Imitrex: y, did not help   Maxalt: No   Zomig: No   Amerge: No   Relpax: No   Axert: No   Frova: No   Migranal (Dihydroergotamine nasal spray): No     Family history:   The patient is not aware about any members of the family with headache.   Reports that Mother had high BP when he was diagnosed with brain tumor, during stress     Social History:   Alcohol: socially, can trigger headache   Smoking tobacco: n  Marijuana: yes, somewhat helpful, last - 6 m ago by 04/2020, consulted   Drugs: n  [place] in [place] area after done with college in 2021 (moving from [place])     Allergies:   NKDA     Examination:   Patient reports that he always had "tilt of the head" - not sure to which side, I don't see during exam today - limited by telemed   Has rt ptosis (did not think he has any new recent changes)   Ht 175.3 cm (5' 9" )  Wt 77.1 kg (170 lb)  BMI 25.10 kg/m   Physical Exam   General: In no acute distress. No dysmorphic features. Well-groomed, good hygiene. Pleasant.   HEENT: Normocephalic, atraumatic, mucous membranes moist, no icterus or ocular injection, oropharynx clear, neck supple   Mental Status: Fully alert and oriented. Follows three step commands. Fluent  language. Naming and repetition intact.   Cranial Nerves: limited by te;emed -pupils are equal, round and reactive to light, visual fields are full. There is rt ptosis. Extraocular movements are intact without nystagmus, facial strength and sensation is symmetric, speech is clear with no dysarthria, shoulder shrugs are full. Hearing is normal and symmetric to finger rub.   Motor: no pronator drift.   Sensation: intact to light touch throughout.   Coordination: no axial instability. Accurate finger to nose bilaterally.   Gait: walks without assistance     Imaging and other testing:     07-27-2019 MRI braina with and wo -No significant interval change. Unchanged expansile T2 and FLAIR  hyperintense lesion involving the right superior colliculus with unchanged  6 mm focus of enhancement.     Reports that CT head done in [place] end of April - Last ct - 2 weeks ago ("vaccine headache, toradol, reglan was helpful - 1 week lasted headache) "- reported 04/22/2020    MRI BRAIN WITHOUT AND WITH CONTRAST    HISTORY: Tectal glioma.     COMPARISON: Comparison is made to a prior exam dated 10/31/2017 and  multiple priors dating back to 05/11/2016.    TECHNIQUE: Multiplanar imaging of the brain was performed before and after  the intravenous administration of 8 cc Gadavist.    FINDINGS:  There is an unchanged appearance to the expansile mass involving  the right superior colliculus demonstrating hazy increased T2/FLAIR. The  central round enhancement measuring 6 mm is also stable. Narrowing of the  adjacent cerebral aqueduct is without significant interval change. Mild  prominence of the lateral ventricles is also stable. 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     There is no restricted diffusion to suggest acute infarct. 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 No intracranial hemorrhage is  visualized. Basal cisterns remain patent. The major intracranial flow-voids  are unremarkable.      HEADACHE DISABILITY:The patient is quite disabled by chronic headache condition, experiencing significant difficulties attending to activities of daily living as evidenced by MIDAS (Migraine Disability Assessment) score of 55 and HIT-6 (Headache Impact Test) of 60.      Impression ad recommendations:   Prestyn Stanco is a 24 y.o. year old male who has a headache, chronic migraine. We will continue Botox.     Patient has brain tumor, glioma, needs follow up with neurosurgery     HTN- needs cardiologist. Had Covid and after that chest pains and palpitations, as spikes in BP- also need to rule out pheo, kidney issues  Ibu can cause elevated BP at time (side effect) and also MOH component is possible     He has neck pain, possible cervicogenic component     He has changes in vision - needs eye dr evaluation     He has rt ptosis, change on neuroexam, reports h/o head and neck traumas, recent covid, needs to discuss to add with his [place] doctors to the next MR studies - MRI brain and MRA brain, MRA neck, with and wo contrast    He has numbness, on and off, h/o neck traunma, h/o chiari, urinary retention - needs to [facility] sure that C spine is addressed, patient said taht he had study MRI, He is planning to follow up with urologist              Patient is in [place], his Mom is in [place], he is completing college and moving to the [place] area. He has headache specialist in [place] and really improved on Botox for chronic migraine. This appt was scheduled for transfer of care so he can receive Botox in 3 months from previous per FDA approved PREEMPT protocol - will be due in Aug 2021, per patients report.    Before Botox he had 15 or more days with significant headache per month, which lasted for longer than 4 hours, longer than 4 months.     Botox - improved after by more than 100 hours a months, will ask to schedule for chr migraine in Aug 2021     As per AVS   "-please discuss MRA brain and neck in addition to MRI brain which you  are planning to have with your neurology, neurosurgery in [place] in 2021 summer (trauma head and neck, also chiropractic procedures including "high velocity" on the neck, "always tilt of the head", right "ptosis)  -please schedule with neurosurgery [place] for brain tumor/chiari follow ups -Manager Angie and Ancil [employer]) will manage the outgoing referral  [phone] (you are transferring to adult neurosurgery from peds in [place] now)   -please send [place] reports, results from C spine MRI (you said that you ad in the past in [place])   -please schedule Botox with [place] in August 2021   -please ask eye  dr to give you a note for [place] with evaluation of intraocular pressure, optic discs, vessels, VF (around 2/21)   -please send [place] most recent results of lab blood work - CBC, CMP, ESR, Thyroid, Vit D   -please follow up with PCP on Blood pressure "spikes", palpitations and chest pains (you said that you had covid at the end of 2020 and started after that) - consider renal and adrenal (pheo rule out) evaluations, referral to cardiology from PCP (primary)   -I referred to cardiology [place] for continuity -Chest pains, palpitations,lightheadedness, had covid in 2020, started after. Has recent spikes in BP (160/130- lasted 1 hour, twice). Urinary retention. Question: are there any contraindications to vasoconstrictives and CGRP blocking agents -Manager Angie and Ancil [employer]) will manage the outgoing referral  [phone]   -please consider avoiding chiropractic procedures on the neck.   -please follow up on urinary retention with PCP and urologist as you planned   -please follow up with spine specialist, you said that yous aw them before (you report neck and low back pain, scans of the neck- "flexion and extension study"-for chiari, low back scans- please send to [place])  -please schedule follow up by telemed with [place] in 1-2 months       Please do only tests and consults which are ok with you and your insurance, please check BEFORE      -if ok with PCP, please start CoQ 10 at the dose 300 mg a day (take in the morning) for headache prevention     "Pain center class [place] with their psychologist:   3rd Tue of the month  10 am to 11.30 am  Please call  Phone - [phone] to register and get info for zoom"  Please discuss with PCP if they would be ok for you to start use of Boswellia for "as needed" headache -300 mg to 500 mg a day     Boswellia (or [place]) is a supplement that has anti-inflammatory properties and may be helpful for headaches.     A nice description about Boswellia here at Dr. Wonda Cheng Blog (he is with the [facility]):   [email or web address]      Boswellia (or [place]) has anti-inflammatory properties and may be helpful for headaches. The recommended dose is 375 mg or 400 mg daily, brands to consider are Gliacin (KeywordPortfolios.[email or web address].br) or Nature's Way.     While these supplements are normally well tolerated, please note that any medication or herbal supplement has the potential to cause an allergic reaction or side effects. If you develop a reaction, please stop the supplement and notify your primary care provider right away.       -please work with PCP on decrease of Ibuprofen use, when possible "     I discussed with the patient that to avoid medication overuse headache the patient needs to limit intake of pain medications to 2 days per week.     I recommend to keep headache diary.   The patient was able to understand instructions.     I spent a total of 150 minutes in face-to-face time with the patient, and in non-face-to-face activities, conducted today directly related to this video visit, including reviewing records and tests, obtaining history, performing examination, placing orders, communicating with other healthcare professionals, counseling the patient, family or caregiver, documenting in the medical record, and/or care coordination for the  diagnoses above.     Addendum  Hi Angie, MZ  Patient will need Botox in August 2021   Please start scheduling   Thank you  Dr Irene Shipper

## 2020-04-22 NOTE — Patient Instructions (Addendum)
-  please discuss MRA brain and neck in addition to MRI brain which you are planning to have with your neurology, neurosurgery in [place] in 2021 summer (trauma head and neck, also chiropractic procedures including "high velocity" on the neck, "always tilt of the head", right "ptosis)  -please schedule with neurosurgery [place] for brain tumor/chiari follow ups -Manager Angie and Ancil [employer]) will manage the outgoing referral  [phone] (you are transferring to adult neurosurgery from peds in [place] now)   -please send [place] reports, results from C spine MRI (you said that you ad in the past in [place])   -please schedule Botox with [place] in August 2021   -please ask eye dr to give you a note for [place] with evaluation of intraocular pressure, optic discs, vessels, VF (around 2/21)   -please send [place] most recent results of lab blood work - CBC, CMP, ESR, Thyroid, Vit D   -please follow up with PCP on Blood pressure "spikes", palpitations and chest pains (you said that you had covid at the end of 2020 and started after that) - consider renal and adrenal (pheo rule out) evaluations, referral to cardiology from PCP (primary)   -I referred to cardiology [place] for continuity -Chest pains, palpitations,lightheadedness, had covid in 2020, started after. Has recent spikes in BP (160/130- lasted 1 hour, twice). Urinary retention. Question: are there any contraindications to vasoconstrictives and CGRP blocking agents -Manager Angie and Ancil [employer]) will manage the outgoing referral  [phone]   -please consider avoiding chiropractic procedures on the neck.   -please follow up on urinary retention with PCP and urologist as you planned   -please follow up with spine specialist, you said that yous aw them before (you report neck and low back pain, scans of the neck- "flexion and extension study"-for chiari, low back scans- please send to [place])  -please schedule follow up by telemed with [place] in 1-2 months       Please do only tests and  consults which are ok with you and your insurance, please check BEFORE     -if ok with PCP, please start CoQ 10 at the dose 300 mg a day (take in the morning) for headache prevention     "Pain center class [place] with their psychologist:   3rd Tue of the month  10 am to 11.30 am  Please call  Phone - [phone] to register and get info for zoom"  Please discuss with PCP if they would be ok for you to start use of Boswellia for "as needed" headache -300 mg to 500 mg a day     Boswellia (or [place]) is a supplement that has anti-inflammatory properties and may be helpful for headaches.     A nice description about Boswellia here at Dr. Wonda Cheng Blog (he is with the [facility]):   [email or web address]      Boswellia (or [place]) has anti-inflammatory properties and may be helpful for headaches. The recommended dose is 375 mg or 400 mg daily, brands to consider are Gliacin (KeywordPortfolios.[email or web address].br) or Nature's Way.     While these supplements are normally well tolerated, please note that any medication or herbal supplement has the potential to cause an allergic reaction or side effects. If you develop a reaction, please stop the supplement and notify your primary care provider right away.       -please work with PCP on decrease of Ibuprofen use, when possible

## 2020-07-10 NOTE — Telephone Encounter (Signed)
Pt would like to establish care with Dr. 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  Per pt; he did not have post op radiation/chemo.      Pt called today because he is having a difficult time gathering records/imaging needed to schedule a visit with Dr. Prudencio Pair.    I spoke with pt.  He states his last brain MRI is from 07/2019.  He will either mail or drop off the CD to our clinic.    I told pt that he will most likely need a new brain MRI.  Pt agrees with this.  He will ask his local doctor to order.  I've also sent a message to the [facility] referring MD, Dr. Irene Shipper asking her if can can order this exam.    I explained to pt that we [facility]  I faxed a records/CD request to this facility today.    I sent pt the below email to follow up from our telephone conversation.      From: Marya Landry   Sent: Thursday, July 10, 2020 2:50 PM  To: [email or web address]  Subject: On Behalf of Dr. Lina Sayre,   Per our telephone conversation, you may use the below link to upload your imaging CD or you may mail the CD (or drop it off) to:    [place]:  Marya Landry  [address]th floor, [address]  [place] [address]    Also, as we discussed, I will fax a records/imaging request to [facility] today.  I will contact you back if/when we receive your prior records/imaging.  Lastly, I will send a message to Dr. Irene Shipper asking if she is able to order a new brain MRI without and with contrast.      The case URL and code below have been sent to [email or web address].  Case URL: [email or web address]  Access Code: [id number]  This login will expire after 30 days (Sat Aug 28 14:47:24 PDT 2021).        Thank you,    What to expect during your next visit to [facility]:  [email or web address]   [email or web address]    [place] Coordinator  Department of Neurosurgery  Email:  [email or web address]   Phone:  [phone]  Fax:  [phone]

## 2020-07-10 NOTE — Telephone Encounter (Signed)
Patient calling to inquire about provider change. Patient reported last Botox done in [place] was June 13, 2020.

## 2020-07-18 NOTE — Telephone Encounter (Signed)
Called pt & gave him the phone # to radiology scheduling to schedule MRI. He will call me after he has this scheduled to set up appt with Dr. Prudencio Pair for after the MRI date.

## 2020-08-13 DIAGNOSIS — C719 Malignant neoplasm of brain, unspecified: Secondary | ICD-10-CM

## 2020-08-13 MED ORDER — GADOTERATE MEGLUMINE 0.5 MMOL/ML (376.9 MG/ML) INTRAVENOUS SOLUTION
0.5 | Freq: Once | INTRAVENOUS | Status: AC
Start: 2020-08-13 — End: 2020-08-13
  Administered 2020-08-14: 02:00:00 15 mL/kg via INTRAVENOUS

## 2020-08-14 ENCOUNTER — Inpatient Hospital Stay: Admit: 2020-08-14 | Payer: Commercial Managed Care - PPO | Primary: Physician

## 2020-08-19 ENCOUNTER — Ambulatory Visit: Admit: 2020-08-19 | Discharge: 2020-08-19 | Payer: PRIVATE HEALTH INSURANCE | Primary: Physician

## 2020-08-19 DIAGNOSIS — Z20822 Contact with and (suspected) exposure to covid-19: Secondary | ICD-10-CM

## 2020-08-19 NOTE — Telephone Encounter (Signed)
Patient Self-Triage  08/19/2020  [facility]   Generic Provider Mychart   Additional Documentation    Encounter Info:  Billing Info,   History,   Allergies,   Detailed Report,   Rehab Report      Legend:        Triggered a BPA Scoring question            [facility] Coronavirus Note    Question 08/19/2020 12:53 PM PDT - Danley Danker by Patient   Do you want to continue? Yes   [place] [facility] Coronavirus Exposure    Question 08/19/2020 12:53 PM PDT - Filed by Patient   In the past 20 days, have you been diagnosed with COVID-19? No   In the past two weeks, have you had close contact with a person with confirmed COVID-19 infection? (Close contact means a meeting or interaction lasting more than 15 minutes with a person who is closer than 6 feet away from you, or physical contact like handshaking, hugging, or kissing. This includes members of your household) No   Have you been fully vaccinated for COVID-19? (greater than 2 weeks since your 2nd dose of mRNA or greater than 2 weeks since your 1st dose of J&J) Yes   [place] [facility] Coronavirus Symptoms    Question 08/19/2020 12:53 PM PDT - Danley Danker by Patient   Are you having continuous, severe pain or pressure in your chest (not just with coughing or breathing)? No   Are you struggling to take each breath or having difficulty speaking because you are so short of breath or Is there an unusual blue color to your lips or face?  No   If you are filling this out for someone else, is it difficult to wake that person up, or is that person unusually confused or disoriented? (If you are filling this out for yourself, answer No) No   Are you having seizures (since this illness began)? No   Are you having new or worsening slurred speech? No   Are you coughing up blood (more than 1 teaspoon)? No   [place] [facility] Coronavirus Symptoms 2    Question 08/19/2020 12:53 PM PDT - Danley Danker by Patient   Are you having trouble breathing that is unusual for you? For example, are you having to stop and catch your  breath more than usual while walking or going up stairs? YesAbnormal    Have you had any of the following symptoms? Sore throatAbnormal     Loss of ability to smell or tasteAbnormal    [place] [facility] Coronavirus Risk Factors    Question 08/19/2020 12:53 PM PDT - Danley Danker by Patient   Do you have any conditions that weaken your immune system? None of the above   Have you been told by a doctor that you have any of the following? Asthma, COPD, emphysema, or any other types of chronic lung diseaseAbnormal    Self Triage Outcomes:Recommended For Covid-19 Testing Within 48 Hours.    Question 08/19/2020 12:53 PM PDT - Danley Danker by Patient   Thank you for your responses. The next screen will show our recommendations for you. OK   Orders Placed     None  Medication Changes       None     Medication List   Visit Diagnoses       None     Problem List

## 2020-08-19 NOTE — Telephone Encounter (Signed)
This patient completed the MyChart Self-Triage and results indicated appropriate for testing.  The [facility] team has entered the order to facilitate this process.      MESSAGE TO [facility] PCP, MEDICAL CONTENT DIRECTOR, OR SPECIALTY PROVIDER:     Your patient Mr.Ralph Allen has called the [facility] reporting COVID-19 compatible symptoms or a recent exposure. You are being notified because they identified you as the [facility] provider who provides the most care to them (this includes patients who may have an upcoming visit scheduled with you to establish care). You have been listed as the authorizing provider for the COVID test. Please follow up with your patient as needed.     If the result is negative, the patient will receive an automated phone call. If the result is positive, a nurse from the [facility] Results, Information, and Short-term Monitoring ([facility]) team will deliver the result, assess them via telephone triage, provide isolation guidance, and evaluate for mAb treatment eligibility.         Thank you,   Leola Brazil, RN  [facility] Nurse Team  [facility] [place]  [phone]

## 2020-08-19 NOTE — Telephone Encounter (Signed)
COVID-19 Patient Triage    1) What is the reason for your call today? Sx's. Filled out sx checker     1a) Have you received J&J vaccine within past 21 days? No    2) Were you directed to call the Coronavirus Hotline and provided with a code (word or number)? No    3) Are you a [facility] patient? Yes, visit with a qualifying [facility] specialist in the last year    3a) Is the patient part of the Care at Home program? No; proceed to the next question.    4) Are you a [facility] Employee?  No; Proceed with screening.    5) Did you complete the MyChart Symptom Screener? Yes. (Copy and paste Self Triage Outcomes here documented under Patient Self Triage, delete the rest of the questions, skip to the Disposition and follow MyChart Symptom Screener workflow listed below. For Emergency Evaluation outcome, continue with screening.)      Self Triage Outcomes:Recommended For Covid-19 Testing Within 48 Hours.            Disposition  [x]  Patient completed the MyChart Symptom Screener  [x]  For Low-risk/severity testing (a.k.a. Covid-Testing within 48 hours)  o If patient has completed a visit with [facility] PCP/qualifying specialist   Complete additional questions for COVID testing order below.   Relay to patient: "I will be escalating this to our clinical nurse triage team. They will be reaching out to you via MyChart with next steps."  - Reason for Call: MyChart COVID Test    o If patient is a new [facility] patient (has upcoming appointment scheduled) and has not completed an appointment with [facility] qualifying provider  - Relay that a video visit may be required prior to being able to order COVID-19 testing.  - Reason for call: COVID-19 Patient Triage  o This telephone encounter was routed to Patient Triage Team at [facility] for further evaluation.       Additional Questions for the COVID testing order:  Date of symptom/s onset?  9.7.21  If exposed, what is the date of exposure? NA  Is this the patient's first test  for COVID-19?  No  Does the patient reside in a congregate living setting (e.g. skilled nursing facility, shelter, dorm)?  No  Is patient employed in a healthcare setting?  No  Is patient pregnant?  Not pregnant  Who provides the most care for you at [facility]?   (please list provider's full name here) Carola Frost [facility]

## 2020-08-20 LAB — COVID-19 RNA, RT-PCR/NUCLEIC ACID AMPLIFICATION: COVID-19 RNA, RT-PCR/Nucleic Acid Amplification: NOT DETECTED

## 2020-09-17 ENCOUNTER — Ambulatory Visit
Admit: 2020-09-17 | Discharge: 2013-07-25 | Payer: Commercial Managed Care - PPO | Attending: Family | Primary: Physician

## 2020-09-17 DIAGNOSIS — G43709 Chronic migraine without aura, not intractable, without status migrainosus: Secondary | ICD-10-CM

## 2020-09-17 MED ORDER — ONABOTULINUMTOXINA 200 UNIT SOLUTION FOR INJECTION
200 | Freq: Once | INTRAMUSCULAR | Status: AC
Start: 2020-09-17 — End: 2020-09-17
  Administered 2020-09-17: 23:00:00 155 [IU] via INTRAMUSCULAR

## 2020-09-17 MED ORDER — NURTEC ODT 75 MG DISINTEGRATING TABLET: 75 mg | ORAL | Status: DC

## 2020-09-17 NOTE — Progress Notes (Signed)
[facility]  Procedure Visit    This is an independent service.  The available consultant for this service is Naaman Plummer, MD.       Ralph Allen is a 24 year old patient with chronic migraine, here for Botox per PREEMPT protocol today. He last had Botox with Dr. Sheral Flow in July in [place] (has received apx 6 rounds, this has been very helpful at keeping headaches under control and improving quality of life. Before starting Botox he had 15 or more days with significant headache per month, which lasted for longer than 4 hours, longer than 4 months. Botox has given an improvement in frequency and severity of symptoms greater than 100 hours per month.     He recently moved to [facility] for work [employer]), and had a visit with Dr. Irene Shipper in May 2021 to establish care with [place] here at the [facility] and continue Botox treatments after his move.     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 He will be seeing Dr. Prudencio Pair here at [facility] tomorrow to establish care for monitoring. Additional history includes hydrocephalus, chiari 1, multiple concussions, possible hypertension (BP wnl in clinic today)      He completed imaging on 08/13/20 (MRI Brain w w/o contrast) reviewed report:  "IMPRESSION:   1.  Since 07/16/2019, no tumor progression. 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  2.  Unchanged nonenhancing FLAIR hyperintense signal in the right globus pallidus, right amygdala, bilateral thalamic pulvinar nuclei, and left caudothalamic groove, suspicious for dysplastic or hamartomatous lesions."    Ralph Allen needs a visit with a newly assigned neurologist here at the [facility] as Dr. Irene Shipper is no longer with our  practice, will send a message to our clinic coordinator to schedule.     Dx: chronic migraine   OnabotulinumtoxinA Injections (PREEMPT PROTOCOL)    Severity of headaches:  Severe    Botox effective: Patient reports that since she started botox, her headache severity improved by greater than 50%, frequency by greater than 50%, and duration by greater than 50%. Overall Patient reports that there has been more than 100 hours of improvement in headache frequency    The risks, benefits and anticipated outcomes of the procedure, the risks and benefits of the alternatives to the procedure, and the roles and tasks of the personnel to be involved, were discussed with the patient.  The patient has given written informed consent to the procedure and agrees to proceed: Yes    UNIVERSAL PROTOCOL / SAFETY CHECKLIST    Procedure to be performed: OnabotulinumtoxinA injection per PREEMPT protocol.    Sign in Communication: Yes    Time Out:  Team Confirms the Correct Patient, Correct Procedure, Correct Site and Site Marking, Correct Position (if applicable), Prep and Dry Time (if applicable).  Time:  0354    Affirmation of Time Out: Yes    Sign Out Discussion: Yes    Treatment   Diluent:   Normal saline   Indication:  Chronic migraine     Injection SItes    Muscle                         Fixed Site/Fixed Dose  L      R          Corrugator                   10 Units divided in 2 sites      5   5       Procerus                       5 Units in 1 site               5     Frontalis                       20 Units divided in 4 sites           10 10    Temporalis                   40 Units divided in 8 sites           20 20        Occipitalis                    30 Units divided in 6 sites            15   15        Cervical PSPs             20 Units divided in 4 sites            10   10    Trapezius                     30 Units divided in 6 sites           15   15          Other sites  SCM                                             Rhomboids                       _________  Subtotals                                      155 Units          Total Units used: 155  Total Units wasted: 45  Patient did tolerate procedure.   Change in Treatment Plan? No

## 2020-09-17 NOTE — Patient Instructions (Signed)
Instruction after botox injection:    - If you have any pain or swelling use ice, 20 min on and 20 min off.  Do not rub or massage the area for 48 hrs.    - If you have any neck stiffness, you may use heat and do stretching exercises.    - This should improve over the next 5 days.   - If it does not, call our office at for further instructions.  - Patient expressed understanding of the above

## 2020-09-18 ENCOUNTER — Ambulatory Visit: Admit: 2020-09-22 | Payer: PRIVATE HEALTH INSURANCE | Attending: Physician | Primary: Physician

## 2020-09-18 DIAGNOSIS — C719 Malignant neoplasm of brain, unspecified: Secondary | ICD-10-CM

## 2020-09-18 NOTE — Progress Notes (Signed)
I performed this evaluation using real-time telehealth tools, including a live video Zoom connection between my location and the patient's location. Prior to initiating, the patient consented to perform this evaluation using telehealth tools.     Physician Statement of Shared Services  This is a shared visit for services provided by me, Franchot Gallo, MD.  I performed a face-to-face encounter with the patient and contributed to this note.     Subjective    Ralph Allen is a with a history of a chiari type I, chronic migraine headaches and was found to have a brain lesion on imaging in 2014. He was reported to have a biopsy and endoscopic third ventriculostomy on December 13, 2012 for the lesion and associated hydrocephalus. 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 He was followed by pediatric neurosurgery in [place] and had serial imaging which is reported to have been stable.    He is seen to establish care with adult neurosurgery and has recently moved to the [place] area.    He reports that he was diagnosed with the mass at 24 years old, they did an ETV and biopsy. He was last seen by pediatric neurosurgeon in [place].      Allergies/Contraindications   Allergen Reactions    Tramadol Nausea And Vomiting     Outpatient Encounter Medications as of 09/18/2020   Medication Sig Dispense Refill    albuterol 90 mcg/actuation metered dose inhaler Inhale 2 puffs into the lungs daily as needed      SUMAtriptan (IMITREX) 50 mg tablet Take 50 mg by mouth daily as needed       No facility-administered encounter medications on file as of 09/18/2020.     No past medical history on file.    No past surgical history on file.  No family history on file.    Social History     Tobacco Use    Smoking status: Current Every Day Smoker     Types: Cigarettes, Cigars    Smokeless tobacco: Never Used   Substance and Sexual Activity    Alcohol use: Yes    Drug use:  Not Currently     Types: Marijuana    Sexual activity: Not on file   Social History Narrative    Not on file     This is a shared visit for services provided by me, Aghi K. Manish, MD. I performed a face-to-face encounter with the patient and the following portion of the note is my own.      Objective    MRI Brain done 08/13/2020:  IMPRESSION:   1.  Since 07/16/2019, no tumor progression. 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  2.  Unchanged nonenhancing FLAIR hyperintense signal in the right globus pallidus, right amygdala, bilateral thalamic pulvinar nuclei, and left caudothalamic groove, suspicious for dysplastic or hamartomatous lesions.  Report dictated by: Scherrie November, MD, signed by: Mickeal Skinner, MD  Department of Radiology and Biomedical Imaging    Assessment and Plan      Ralph Allen is a 24 y.o. 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  I will see him in one year with a follow up MRI.    Thank you for allowing me to participate in this patient's care and do not hesitate to contact me if you have  additional questions regarding this patient or other adult brain tumor patients in your care.     I spent a total of 30 minutes with the patient and 25 minutes of that time was spent counseling.  Counseling topics addressed included symptoms, treatment plan, healthcare maintenance as above.

## 2020-12-18 ENCOUNTER — Ambulatory Visit
Admit: 2020-12-18 | Discharge: 2021-01-06 | Payer: Commercial Managed Care - PPO | Attending: Neurology | Primary: Physician

## 2020-12-18 DIAGNOSIS — G43709 Chronic migraine without aura, not intractable, without status migrainosus: Secondary | ICD-10-CM

## 2020-12-18 MED ORDER — ONABOTULINUMTOXINA 200 UNIT SOLUTION FOR INJECTION
200 | Freq: Once | INTRAMUSCULAR | Status: AC
Start: 2020-12-18 — End: 2020-12-18
  Administered 2020-12-18: 21:00:00 155 [IU] via INTRAMUSCULAR

## 2020-12-18 NOTE — Progress Notes (Signed)
BOTULINUM TOXIN INJECTION  PROCEDURE NOTE    Re: Ralph Allen  MRN: [id number]  Date: 12/18/2020    Last visit brief summary [id number]/95/7473 (NP [name]): Botox administered.    Before starting Botox he had 15 or more days with significant headache per month, which lasted for longer than 4 hours, longer than 4 months. Botox has given an improvement in frequency and severity of symptoms greater than 100 hours per month.     BP 117/60 (BP Location: Left upper arm, Patient Position: Sitting, Cuff Size: Adult)   Pulse 90   Temp 36.6 C (97.9 F) (Temporal)   Ht 176 cm (5' 9.29")   Wt 78.9 kg (174 lb)   BMI 25.48 kg/m      Diagnosis: chronic migraine  Procedure: OnabotulinumtoxinA Injections (PREEMPT protocol)    The risks, benefits, and anticipated outcomes of the procedure, the risks and benefits of the alternatives to the procedure, and the roles and tasks of the personnel to be involved, were discussed with the patient, and the patient consents to the procedure and agrees to proceed. Written informed consent signed and submitted for scanning into the electronic medical record.    UNIVERSAL PROTOCOL / SAFETY CHECKLIST   Procedure to be performed: OnabotulinumtoxinA injections, PREEMPT protocol   Time Out: The correct patient, procedure, and site were confirmed at 1:04PM.    PROCEDURE    Treatment #8 (approximately)  Diluent: normal saline  Indication: chronic migraine     Injection SItes    Muscle Site & Dose Left  Right   corrugators 10 units, 2 sites 5  5   procerus 5 units, 1 site  5    frontalis 20 units, 4 sites 10  10   temporalis 40 units, 8 sites 20  20   occipitalis 30 units, 6 sites 15  15   cervical paraspinals 20 units, 4 sites 10  10   trapezius 30 units, 6 sites 15  15   additional (follow-the-pain)        TOTAL  155  units                      Total Units used: 155  Total Units wasted: 45    Patient tolerated the procedure without complications or significant blood loss. Post procedure instructions  were provided to the patient and all questions were answered.     Change in Treatment Plan? No    Lot: [id number]  Exp: 08/2023      Follow up with myself 01/09/2021 (already scheduled) for full transfer of care visit    Robet Leu  Assistant Professor of Neurology  [facility]

## 2021-01-09 ENCOUNTER — Ambulatory Visit: Payer: Commercial Managed Care - PPO | Attending: Neurology | Primary: Physician

## 2021-01-09 DIAGNOSIS — R350 Frequency of micturition: Secondary | ICD-10-CM

## 2021-01-09 DIAGNOSIS — G43709 Chronic migraine without aura, not intractable, without status migrainosus: Secondary | ICD-10-CM

## 2021-01-09 MED ORDER — NAPROXEN 500 MG TABLET
500 | ORAL_TABLET | Freq: Two times a day (BID) | ORAL | 0 refills | Status: DC
Start: 2021-01-09 — End: 2021-05-19

## 2021-01-09 NOTE — Patient Instructions (Signed)
Thank you for allowing me to participate in your care. I hope that you found your visit today helpful and that I have addressed most of your questions and concerns! Our clinic provides mainly consultation visits. Detailed recommendations will be sent to your primary care physician (PCP) or local neurologist. Please make an appointment with your physicians to discuss recommendations and to receive the testing, referrals, or medication prescriptions recommended.    The best method of communication with my office and myself is either by calling our phone at [phone] or via [place]. More details on clinic information and use of MyChart are provided below:    CLINIC INFORMATION  Address: [facility], [place], [place] [address]  Nurse phone number: [phone].  Scheduling phone number: [phone]. May also schedule visits through [place].  Fax number for records: [phone]    [facility] INFORMATION  We cannot address urgent medical issues by MyChart.  We generally do not prescribe medications by MyChart request. A visit is required before making treatment changes.   We are unable to provide work-related letters or disability forms.  If you have new or worsening symptoms, see your primary care provider, urgent care, or go to the nearest emergency room.  If you have detailed questions about diagnosis and treatment, please save your questions for your next visit with the headache specialist.    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    Barnabas Lister is doing well at this time with Botox and as needed Rimegepant (use needs currently infrequent). Frequency is ~10/30 monthly headaches with most being mild to moderate only. We will make no changes to his treatment plan today.    To note, he is in between PCPs and requests referral to Urology for known urinary frequency issues for years for which he previously saw someone in [place] and has yet to establish care locally in [facility]. I will place this referral.    Also, reports upcoming likely COVID19 booster shot. Given headache exacerbation with previous 2 shots, we discussed a Naproxen "Bridge" to help him through, as outlined in detail below.     We are next scheduled to see each other 03/19/2021 for ongoing Botox.    RECOMMENDATIONS:  Recommend diagnostic testing:  1. At this point, agree with Neurosurgery (Dr. [name]) plans for 1-year follow up surveillance MRI Brain w/wo Contrast (falling ~08/2021)  2. Referral to Urology - placed today. Patient needs to establish care locally since his move to [facility]  Prophylactic treatment:  1. Continue Botulinum toxin PREEMPT Protocol injections - already scheduled for 03/19/2021 (his ~10th such treatment of life)  2. Future considerations:  a. Memantine  b. Neutraceuticals  c. Cefaly Device (  preventive setting)  d. CGRP mABs: Emgality, Aimovig, Ajovy, Vyepti  e. Gepants (preventively): Rimegepant, Atogepant  f. SNRIs: Venlafaxine, Duloxetine  g. Other GABAs: Pregabalin  h. AEDs: Lamotrigine, Topiramate, Depakote, Zonisamide  i. Other Beta Blockers: Atenolol, Metoprolol  j. Anti-hypertensives: Candesartan,  Lisinopril, Amlodipine, Verapamil  k. Other TCAs: Amitriptyline  l. Cyproheptadine  Acute treatments:  1. Continue Rimegepant (Nurtec ODT =an oral CGRP blocking small molecule) 3m daily as needed for headache spikes  2. Can continue over-the-counter non-steroidal anti-inflammatory medications (NSAIDs) such as Ibuprofen 600-8057m Naproxen 220-44053mor Tylenol 1000-2000m59mr acute headache  a. You may even choose to combine this with your GEPANT for hopeful improved (synergistic) effect. We would call this "double acute therapy"  b. Cautious to LIMIT total monthly NSAID use to on no more than 12 days per month, so as to avoid the development of "rebound" headaches (or medication-overuse headache/MOH)   3. For upcoming COVIColumbusridge treatment" with high-dose Naproxen 500mg44mce daily for 5 days, beginning 2 days prior to the injection and continuing three days post-injection  a. This may be continued up to 7-10 days total, if headache still persists beyond 5 day course  4. Future considerations:  a. Anti-emetics  b. Other NSAIDs: Diclofenac, Toradol  c. Other Gepants: Ubrogepant  d. Other Triptans, namely longer-acting ones (Frova, Nara) to try to mitigate side effects  e. Cefaly Device (acute setting)  f. Nerivio Migra Device  g. Hydroxyzine  h. At-home SPG blockade  Non-pharmacological approaches:  1. Headache lifestyle modifications always ongoing. The biggest areas of focus for you seem to include increased hydration; ongoing regular exercise, regular sleep habits, stress reduction/coping strategies  2. Future considerations:  a. Occipital nerve blocks  b. Trigger point injections  c. SPG blocks    Follow up with Dr. KevinNicholes Rough) for ongoing coordination of care  Follow up with myself in 03/19/2021 for Botox - already scheduled    The patient understood that, as a tertiary specialty clinic, this was a consultation visit only. That we ask that the patient work with his PCP to consider  and ultimately decide whether to implement the following recommendations. The PCP is more familiar with the patient's other health conditions and psychosocial concerns. Having medications managed by a single provider also limits duplication and risk of interactions. This time I will prescribe: Naproxen "bridge" for upcoming booster shot and Urology referral, as pt is in between PCPs      If you are interested in being a part of Headache Research at [place] let me know via [facility] and I will put our research coordinator in touch with you.      THE MAINSTAY OF HEADACHE TREATMENT: incorporates 3 areas which must all be addressed in order to provide comprehensive/holistic care with the greatest chance for effective results:  1) Lifestyle modifications **See General Headache Instructions  2) Prevention: such treatments reduce how often headaches occur, how severe they are, and how long they last. Prevention should be considered if you have more than 4-8 headache days per month, or if headaches are very disabling when they occur less frequently. Each prevention treatment should ideally be tried 1 at a time for a trial of 2-3 months at the right dose. Carefully track headaches (how often they happen, and how severe they are) to see if there is any benefit. Prevention treatments come from various medication classes used also to treat other medical conditions, for example:  - Antidepressant medications: such  as nortriptyline, amitriptyline, duloxetine, venlafaxine.  - Anti-seizure/anti-epileptic medications: such as topiramate, valproate, gabapentin  - Blood pressure lowering medications (antihypertensives): such as propranolol, candesartan, verapamil  - CGRP monoclonal antibody therapies: such as Aimovig, Ajovy, Emgality (injections given once every month, designed to treat episodic and chronic migraine)  - Other classes: such as Cyproheptadine, Memantine  - Botox PREEMPT protocol: FDA-approved for chronic  migraine  - Neuromodulation devices: such as the Cefaly, GammaCore  3) Acute treatment: are used as-needed to treat headache when it is happening. This type of treatment is best for people who experience a few headache days a month. In general, do not take on more than 2 days per week (or 10 days per month). If taken more often, can worsen headaches ("medication overuse" or "rebound" headache). Fioricet should be avoided given high risk for rebound. Opiates/narcotics should be completely avoided as they can cause pain to worsen over time, may increase sensitivity to pain, and even make prevention treatments less effective, in addition to the carried risk of dependence (needing more medication to achieve the same level of pain relief, and withdrawal when stopped), addiction, and fatality. Acute treatments come from various medications classes as follows:  - Over-the-counter analgesics like ibuprofen (Motrin, Advil), naproxen (Aleve), and acetaminophen (Tylenol): can be used for mild to moderate headache. Note Excedrin has a higher risk of rebound headache because of the added ingredient of caffeine and should be minimized.   - Triptans: which are migraine specific medications used for moderate to severe migraine. Read more about triptans below.  - Gepants: which are a new class of migraine specific medications used if triptans do not work. These include Nurtec and Ubrelvy.  - Ditans: the newest class of migraine-specific medications, including Lasmiditan  - Some anti-nausea medications work for migraine headache and nausea (Compazine, Phenergan, and Reglan)  - Neuromodulation devices: such as the Cefaly, GammaCore    **General Headache Lifestyle Instructions:  1) Maintain a headache diary; learn to identify and avoid triggers  2) Limit use of acute treatments (over-the-counter medications, triptans, etc.) to no more than 2 days per week or 10 days per month to prevent medication-overuse or rebound headache  3) Follow  a regular schedule (including weekends and holidays) for the next 6 weeks:    A) SLEEP: Attempt 8 hours of sleep nightly    B) DIET: Do not skip meals. Eat protein throughout the day (it is especially important to start the day with protein). Otherwise, there is unfortunately no good "migraine diet" outside of eating healthy, regularly scheduled meals and avoiding any foods you in particular have noticed to be triggers for your headaches.  This being said, there is increasing recent evidence to support commitment to high Omega-3 and low Omega-6 fatty acid diet (Ramsden CE, et al. Dietary alteration of n-3 and n-6 fatty acids for headache reduction in adults with migraine: randomized controlled trial. BMJ. 2021 Jun 30;374:n1448. doi: 10.1136/bmj.n1448. PMID: 23762831; PMCID: [id number])  Note that food triggers can vary from person to person, although there are some foods you may want to avoid as they are more commonly known to be headache triggers, including:  -Caffeine (coffee, chocolate, tea, cola/pop/soda (7-up, Sprite, [address] Ginger Ale, Mug/A+W Root Beer, [place], Slice are okay)  -Foods containing nitrates (deli meat, ham, bacon, sausage, hot dogs)  -Tyramine (aged cheese; can only have American cheese, cottage cheese, Velveeta and fresh mozarella (most pizza uses aged mozzarella))  -MSG (Chinese/Hispanic foods, Doritos, all flavored chips and  Ramen noodles)  -Nutrasweet and artificial sweeteners    C) STRESS MINIMIZATION. Everyone experiences stress. It is a part of daily life! Managing stress is important for many health conditions including headaches. Some techniques you can try include:  - Meditation, deep breathing, and progressive muscle relaxation (tensing and then relaxing all the major muscle groups, one at a time)  - Take slow, deep breaths  - Think of something that makes you feel happy and calm.  - Listen to music or take a walk in nature.  - Gentle exercises like yoga and tai chi  can also help.  - Manage your time to allow for a portion of your day for your own well being.  - Spend time with people who are important to you, like family and friends.  - Use the above lifestyle management techniques of regular sleep, exercise, meals, and hydration.  - See a counselor/therapist to help manage stress or other related conditions like depression and anxiety see    D) EXERCISE: think 30 minutes per day, 3-5 times per week. The goal is "moderate intensity" exercise. This means you are exercising at 70% of your maximum heart rate and should result from an activity that raises your heart rate, breathing rate, and causes you to sweat. This can consist of brisk walking, jogging, biking, swimming, a dance class, a high intensity yoga class, or other aerobic exercise class. (Note that being overweight is associated with a 5 times increased risk of chronic migraine.)    E) HYDRATION: Keep well hydrated. Think 6-8 glasses of water per day  4) Don't wait!! Initiate non-pharmacologic measures at the very earliest sign of headache. Take the maximum allowable dosage of prescribed medication    A) Rest and quiet in a cool, dark environment.    B) Relax and reduce stress.    C) Cold compress to head (place a dry washcloth to forehead, cover with a blue freezer packet and use a headband to press the freezer packet across the forehead and temples)  5) Compliance: Ensure to take prescribed medication regularly as directed  6) Communicate: Reach out to your physician when problems arise, especially if your headaches change, increase in frequency/severity, or become associated with neurological symptoms (weakness, numbness, slurred speech, etc.)  7) Consider various complementary methods of treatment, including behavioral therapy, psychological counselling, biofeedback, massage therapy, acupuncture, and other modalities. Such measures may reduce the need for medications. Counseling for pain management, where patients  learn to function and ignore/minimize their pain, seems to work very well.  8) Recommend changing your family's attention and focus away from your headaches... Instead, emphasize daily activities. If first question of day is 'How are your headaches/Do you have a headache today?', then you may constantly think about your headaches, thus making them worse. The goal is to re-frame/re-direct attention away from your pain and towards daily activities and other distractions!  9) Implore good sleep hygiene to improve your headaches:   1. Go to bed only when sleepy and intending to sleep  2. Leave the bedroom if unable to sleep after 20 min and return only when sleepy  3. Use the bedroom only for sleep and sexual activity  4. Set and alarm and rise at the same time every day  5. Restrict your time in the bed to your total sleep time plus 30 min  6. Eat 4 hours prior to bedtime, do not snack or drink (other than small sips) prior to bed  7. Do not  watch television or use electronic devices in bed or 1 hour prior to bed      Watch Me! Migraine 101, a [employer] [facility] Migraine educational video (5:35 minutes): http://youtu.be/jHffEt-MXjI       [employer] Self-Management Programs: To find a low cost/free health workshop in your area please visit [email or web address]       Vitamins and herbs that show potential for migraine prevention:  *For ratings on available supplements, you can refer to https://labdoor.com/ for assistance  Magnesium: Magnesium (400-623m at bedtime of typically, MgOxide or MgCitrate formulations) is part of the messenger system in the serotonin cascade and has a relaxant effect on smooth muscles, such as blood vessels. We often give intravenous magnesium to patients who come into the emergency department for migraine because it helps to break the migraine. Three trials found 40-90% average headache reduction when used as a preventative. Low brain magnesium has been associated  with migraine aura and studies suggest magnesium supplementation can be helpful for migraine with aura in particular. Magnesium also demonstrated benefit in menstrually-related migraine. It is also useful for constipation - which can be a side effect of other medications used to treat migraine. Good natural food sources include: nuts, whole grains, tomatoes.    Riboflavin (Vitamin B2): 2078mtwice a day (or 40072maily). This vitamin assists nerve cells in the production of ATP, a principal energy storing molecule. It is necessary for many chemical reactions in the body. There have been at least 3 clinical trials of riboflavin using 400m21mr day all of which suggested that migraine frequency can be decreased. All 3 trials showed significant improvement in over half of migraine sufferers. The supplement is found in bread, cereal, milk, meat, and poultry. Most Americans get more riboflavin than the recommended daily allowance, however riboflavin deficiency is not necessary for the supplements to help prevent headache.    Coenzyme Q10: This is an antioxidant present in almost all cells in the body and is critical component for the conversion of energy. Recent studies have shown that a nutritional supplement of CoQ10 can reduce the frequency of migraine attacks by improving the energy production of cells as with riboflavin. Doses of 200mg52m 150mg)53mce a day have been shown to be effective.    Feverfew: Feverfew is a common garden herb native to [place] for disorders typically controlled by aspirin. The mechanism of action is unknown but is believed to be related to a chemical called parthenolide which helps the body use serotonin more effectively. Serotonin helps prevent migraine and assists with resolution when it occurs. Parthenolide also inhibits the release of histamine which is linked to pain and inflammation. Consistency of active ingredients in different products  can be a problem. Some formulations don't have the active ingredient (parthenolide) that prevents migraine. A parthenolide content of 0.2% is generally recommended. Typical dosage is one capsule 3 times a day.    Melatonin: Increasing evidence shows correlation between melatonin secretion and headache conditions. Melatonin supplementation has shown decreased headache intensity and duration. It is widely used as a sleep aid. Sleep is nature's way of dealing with migraine. A dose of 3mg is64mcommended to start for headaches including cluster headache. Higher doses up to 15mg ha61men reviewed for use in Cluster headache and have been used. The rationale behind using melatonin for cluster is that many theories regarding the cause of Cluster headache center around the disruption of the normal circadian rhythm in  the brain. This helps restore the normal circadian rhythm. It should be taken at least 2 hours before bedtime.    Ginger: Ginger has a small amount of anti-histamine and anti-inflammatory action which may help headache. It is primarily used for nausea and may aid in the absorption of other medications    Butterbur: Butterbur, a shrub generally taken in oral form, and studies support that it is effective for prevention in some patients. However, due to a rare but serious risk of liver toxicity, it has been removed from the market in some countries, and many headache experts in the [place] have stopped recommending its use. In other words: Effective, but proceed with caution and know the risks.      BIOFEEDBACK PROVIDER SEARCH: USFirm.co.nz      AVAILABLE APPS FOR MINDFULNESS THERAPY/MEDITATION AND SLEEP/RELAXATION:  Stop, Breathe and Think. Personalized daily meditation and breathing exercises based on a personalized questionnaire completed before each session.    Calm (deep breathing/body scan). Offers a one week free trial and then costs $69.99 per year. This app focuses on deep breathing exercises and body scan.      Headspace. Free 10 day instruction on how to meditate. You then have the option to purchase monthly or yearly subscriptions.     Relax Lite. Stress and anxiety relief through step-by-step instructions for deep breathing and daily meditation. Full version is $3.00.     10% Happier. $99.99 per year subscription after 7 day free trial. Offers 350+ guided meditations from world renowned teachers with new meditations added every week. Personal coaching available from meditators with 10+ years of experience.     Johney [place]. Monitor cognitive distortions as part of Cognitive Behavioral Therapy (CBT). Challenge your thoughts when you think they could be a cognitive distortion. Learn more about cognitive distortions and ways to recognize and reframe your negative thinking.     Companion. Free version available, but comes with a $2 monthly subscription for full access. Offers 3 different categories on daily support for proper breathing, relaxation and CBT.     Diapason. Weekly evaluations and activities to help improve your mood, sleep, stress and concentration. Must subscribe for full version of the app.    Ctrl M Health. A comprehensive personalized program of wellness, customized physical activities, daily motivation, and in-depth articles put together by the [facility] [place], [place]). $19.99 monthly, $44.99 every 3 months, or $149.99 annually.      PSYCHOEDUCATIONAL VIDEOS:  Informational video on why to stop opioids (1:45 minute): http://youtu.be/MI27mFQPdCE     Why things hurt, A TedX talk by LOnnie Graham(14:32 minutes): hAbsolutelyGenuine.com.br   The Role of the Brain in Chronic Pain by LOnnie Graham(24:10 minutes): hPizzaHeadquarters.[email or web address]      RESOURCES FOR LEARNING MORE ABOUT HEADACHE CONDITIONS  American Migraine Foundation  https://americanmigrainefoundation.org/    Migraine World  [place]  HdDotCom.si   OSears Holdings Corporationwebsite on headaches and migraines:  https://www.oScrapbookLive.fr     WHEN TO CONTACT YOUR HEADACHE SPECIALIST  - If you have a question about a treatment recommended during a visit.  - If you think you are experiencing side effects from a medication prescribed by the headache specialist.   - If you are unsure how to take a medication that was prescribed by the headache specialist.  - If you are providing information about test results or records requested.  - If you would like to provide an update about inpatient admission.  - If you would  like to request a procedure recommended to you.

## 2021-01-09 NOTE — Progress Notes (Signed)
[facility]  Department of Neurology, [place] Follow Up Visit    Name: Ralph Allen   DOB: May 20, 1996  Date: 01/09/2021    Referring Provider/[place]P:  [name]  [id number]  [place] [address]    CHIEF COMPLAINT: Stefanie Libel), a former patient of Dr. 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    I performed this consultation using real-time Telehealth tools, including a live video connection between my location and the patient's location. Prior to initiating the consultation, I obtained informed verbal consent to perform this consultation using Telehealth tools and answered all questions about the Telehealth interaction.    Brief Headache History: [name]'s headaches began around 8th grade. Following a couple of concussions with worsening headaches he eventually underwent brain imaging and was found to have a brain tumor and hydrocephalus (age 25) s/p biopsies (confirmed low-grade astrocytoma; on imaging monitor; no resection/RT/Chemo to date) s/p ETV with improvement in headaches for some time. 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 At his worst, reports frequency was near-daily (27/30 monthly days). He had tried various treatments and has had the most success with Botox PREEMPT protocol since early 2020. He moved to [facility] ~06/2020 and established care with Dr. Ennis Forts at our [facility] just prior (04/22/2020), where it was decided they would continue Botox for tx of chronic migraine. He now presents to formally transition care to myself today, reporting:  FREQUENCY: ~10/30 SEVERITY: 3-4/10 average with spikes to >5/10 on <1-1 days  LOCATION: bilateral frontal, sinus, occasional temporal and vertex CHARACTER: pressure-like, throbbing DURATION: a couple of hours ASSOC MIGRAINE SXS: photophobia, phonophobia, osmophobia, nausea (rare), brain fog, lightheadedness, scalp allodynia PRODROME: "a sense that headaches are coming", tingling in the head, disorientation AURA: Denies CRANIAL AUTO SXS: lacrimation, +occasional agitation TRIGGERS: stress, stress let-down, undersleeping, alcohol, bending forward ALLEVIATING FACTORS: distraction ATYPICAL SXS: sometimes numbness on "both sides of face", cheeks; +tinnitus (occasional, brief <5 sec, not always headache-associated, bilateral, high-pitch, non-pulsatile); "periods of vertigo"  There are no positional features to the headaches including change with upright, lying down, valsalva, cough/sneeze; however, bending over is a big trigger for him (->lightheadednes and pain) - chronically so; further, there is no pulsatile tinnitus, diplopia, or TVOs.    INTERVAL HISTORY & EVENTS since our last visit on 12/18/2020 (for Botox):  Denies issues with the injections. Since last visit things have been stably better. Current frequency is estimated at 10/30 headache days; with 0/10 severe.    There is nothing new or different about the headache features or phenomenology.    VISIT HISTORY:  - 04/22/2020 (Dr. Irene Shipper): Initial consultation - dx chronic migraine. Planned to continue Botox PREEMPT Protocol.  - [id number] (NP [name]): Botox ~#4-1 of live  - 12/18/2020: *1st visit with myself* Botox ~#8 of life  - 01/09/2021: Today's Televisit, as above.    REVIEW OF SYSTEMS:    There were headache and related neurological and constitutional symptoms, as noted above in the HPI. All other systems were reviewed and are negative.    Focused Headache Review of Systems:  Caffeine Does not really drink caffeine; only occasional soda  Meals Tends to eat smaller, more frequent (5) meals/day -  finds this helpful for his  headaches  Hydration Drinks some H2O/day; this is an area for improvement  Exercise "A fair amount"; [employer], walking, golf  Sleep Goes to bed at 11PM, wakes up at 8:30-9AM. Denies issues falling asleep; Denies issues staying asleep. Denies daytime fatigue.  Mood Doing well  Prior psychiatric diagnosis H/o adjustment disorder after his brain tumor dx  Prior neck/head trauma Several head injuries, mTBIs/concussions in life, with headaches worsening each time  Neck symptoms "I hold tension in my neck"    PMH/PSH  1. Headaches  2. Brain tumor - dx age 25 on imaging; s/p 2 biopsies in 2014, biopsy-proven pilocystic astrocytoma (low-grade) dx 12/13/2012 c/b hydrocephalus s/p 3rd ventriculostomy; stable on imaging monitoring last 08/13/2020; no resection or RT to date; established with [facility] (Dr. [name]), with plans for 1-year follow up MRI (~08/2021)  3. 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 H/o hypertension - "low resting blood pressure" but it spikes up at M.D. offices; does have notable history of HTN in mother  63. IBS - mainly constipation  6. Urinary frequency - has seen Urology for this back in [place].  7. Broken nose s/p surgical repair  8. Sinus problems - "ears pop constantly"  9. 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    [place]:  -Rimegepant 75mg  PRN headache - uses 1-2/30, reduces severity, no SEs   -Ibuprofen PRN headache - uses  0/30, occasionally helpful, no SEs  -Botulinum toxin injections PREEMPT Protocol - since early 2020, LD 12/2020, effective to reduce frequency and severity of headaches, no SEs  Other current medications reviewed:  -Albuterol PRN  -Nasacort PRN  -Zyrtec PRN  -MVI daily  -Vitamin C daily  -Vitamin D daily    ---------------------------------------------------------------------------------------  PAST PREVENTIVE TRIALS:  -Melatonin  -Nortriptyline   -Gabapentin - initially helpful but headaches returned after 9 months, no SEs  -Propranolol  -Botox (as above)     PAST ACUTE TREATMENT TRIALS:  -Sumatriptan - no effect, ->"want to throw up"  -Rizatriptan ->throw up  -Rimegepant (as above)   -Ibuprofen (as above)     PAST NON-PHARMACOLOGICAL AND PROCEDURAL TRIALS:  -Denies  ---------------------------------------------------------------------------------------    ALLERGIES:  Tramadol ->nausea/vomiting    EXAMINATION:  General exam:  Well-appearing and in no acute distress. No dysmorphic features. Well-groomed.   Musculoskeletal: Head is normocephalic, atraumatic, scalp is clear. Neck is supple, with good range of motion; Spurling's sign was negative.  HEENT: Oropharynx is clear, mucous membranes appear moist; eyes were non-icteric and sclera not injected.  Pulmonary: Breathing comfortably  Cardiovascular: No peripheral edema  Abdomen: non-distended and non-tender  Extremities: normal in appearance   Skin: no rashes  Neurological Exam:  Mental Status: Fully alert and oriented to person, place, and time. Follows three step commands. Fluent speech. Naming and repetition intact. Affect was normal.   Cranial Nerves: Pupils are equal, visual fields are full to video exam. There is no ptosis.  Extraocular movements are intact without nystagmus, facial strength and sensation are symmetric, hearing seems normal, tongue movement looks normal; speech is clear with no dysarthria, shoulder shrugs are full.  Motor: no tremors noted,  pronator drift not present, fine finger taps are rapid and symmetric.   Sensation: intact to light touch throughout  Coordination: no axial instability. Accurate finger to nose bilaterally.  Gait: seems normal, narrow-based gait; able to toe/heel, and tandem    INTERVAL DIAGNOSTIC TESTING:  None    Previous relevant diagnostics to note:  Last MR Brain w/wo Contrast 08/13/2020 (obtained for tumor surveillance): IMPRESSION:   "1. Since 07/16/2019, no tumor progression. Unchanged exophytic 73mm round enhancing nodule centered in the right medial thalamus/superior colliculus, compatible with reported biopsy proven pilocystic astrocytoma.  2. Unchanged nonenhancing FLAIR hyperintense signal in the right globus pallidus, right amygdala, bilateral thalamic pulvinar nuclei, and left caudothalamic groove, suspicious for dysplastic or hamartomatous lesions."    Other MRI Brains:  -w/wo Contrast  08/04/2018: "FINDINGS: There is an unchanged appearance to the expansile mass involving the right superior colliculus demonstrating hazy increased T2/FLAIR. The central round enhancement measuring 6 mm is also stable. Narrowing of the adjacent cerebral aqueduct is without significant interval change. Mild prominence of the lateral ventricles is also stable. Bifrontal ventricular shunt catheter tracts are again identified.   There is no restricted diffusion to suggest acute infarct. A Chiari type I malformation is again seen, unchanged. No intracranial hemorrhage is visualized. Basal cisterns remain patent. The major intracranial flow-voids are unremarkable.  IMPRESSION: No significant interval change. Unchanged expansile T2 and FLAIR hyperintense lesion involving the right superior colliculus with unchanged 6 mm focus of enhancement."    CT Head w/o Contrast 03/27/2020: IMPRESSION:  "1. Cerebellar tonsils extending 8 9 mm beyond the foramen magnum with prominence of the ventricles, which is concerning for Chiari I  malformation."    ASSESSMENT: Wasif Barnabas Lister) is a 25 year old right handed man with IBS, urinary frequency, sinus problems, h/o multiple mTBIs/concussions, known low-grade biopsy-proven pilocytic astrocytoma (stable on imaging monitoring since) dx 12/2012 c/b hydrocephalus s/p ETV with subsequent improvement in headaches at that time, however a return of chronic headaches a few years later (during college) - a former patient of Dr. Ennis Forts' here at our [place] with headache dxs of chronic migraine without aura, also h/o headache attributed to intracranial neoplasia and hydrocephalus (s/p ETV in 2014) and likely posttraumatic contribution to headaches - who was seen in follow up today and to transition care to myself.    Barnabas Lister is doing well at this time with Botox and as needed Rimegepant (use needs currently infrequent). Frequency is ~10/30 monthly headaches with most being mild to moderate only. We will make no changes to his treatment plan today.    To note, he is in between PCPs and requests referral to Urology for known urinary frequency issues for years for which he previously saw someone in [place] and has yet to establish care locally in [facility]. I will place this referral.    Also, reports upcoming likely COVID19 booster shot. Given headache exacerbation with previous 2 shots, we discussed a Naproxen "Bridge" to help him through, as outlined in detail below.     We are next scheduled to see each other 03/19/2021 for ongoing Botox.    RECOMMENDATIONS:  Recommend diagnostic testing:  1. At this point, agree with Neurosurgery (Dr. [name]) plans for 1-year follow up surveillance MRI Brain w/wo Contrast (falling ~08/2021)  2. Referral to Urology - placed today. Patient needs to establish care locally since his move to [facility]  Prophylactic treatment:  1. Continue Botulinum toxin PREEMPT Protocol injections - already scheduled for 03/19/2021 (his ~10th such treatment of life)  2. Future  considerations:  a. Memantine  b. Neutraceuticals  c. Cefaly Device (preventive setting)  d. CGRP mABs: Emgality, Aimovig, Ajovy, Vyepti  e. Gepants (preventively): Rimegepant, Atogepant  f. SNRIs: Venlafaxine, Duloxetine  g. Other GABAs: Pregabalin  h. AEDs: Lamotrigine, Topiramate, Depakote, Zonisamide  i. Other Beta Blockers: Atenolol, Metoprolol  j. Anti-hypertensives: Candesartan, Lisinopril, Amlodipine, Verapamil  k. Other TCAs: Amitriptyline  l. Cyproheptadine  Acute treatments:  1. Continue Rimegepant (Nurtec ODT =an oral CGRP blocking small molecule) 75mg  daily as needed for headache spikes  2. Can continue over-the-counter non-steroidal anti-inflammatory medications (NSAIDs) such as Ibuprofen 600-800mg , Naproxen 220-440mg , or Tylenol 1000-2000mg  for acute headache  a. You may even choose to combine this with your GEPANT for hopeful improved (synergistic) effect. We would call this "double acute therapy"  b. Cautious to LIMIT total monthly NSAID use to on no more than 12 days per month, so as to avoid the development of "rebound" headaches (or medication-overuse headache/MOH)   3. For upcoming [place]: "Bridge treatment" with high-dose Naproxen 500mg  twice daily for 5 days, beginning 2 days prior to the injection and continuing three days post-injection  a. This may be continued up to 7-10 days total, if headache still persists beyond 5 day course  4. Future considerations:  a. Anti-emetics  b. Other NSAIDs: Diclofenac, Toradol  c. Other Gepants: Ubrogepant  d. Other Triptans, namely longer-acting ones (Frova, Nara) to try to mitigate side effects  e. Cefaly Device (acute setting)  f. Nerivio Migra Device  g. Hydroxyzine  h. At-home SPG blockade  Non-pharmacological approaches:  1. Headache lifestyle modifications always ongoing. The biggest areas of focus for you seem to include increased hydration; ongoing regular exercise, regular sleep habits, stress reduction/coping strategies  2. Future  considerations:  a. Occipital nerve blocks  b. Trigger point injections  c. SPG blocks    Follow up with Dr. [name] (PCP) for ongoing coordination of care  Follow up with myself in 03/19/2021 for Botox - already scheduled    The patient understood that, as a tertiary specialty clinic, this was a consultation visit only. That we ask that the patient work with his PCP to consider and ultimately decide whether to implement the following recommendations. The PCP is more familiar with the patient's other health conditions and psychosocial concerns. Having medications managed by a single provider also limits duplication and risk of interactions. This time I will prescribe: Naproxen "bridge" for upcoming booster shot and Urology referral, as pt is in between PCPs    I spent a total of 170 minutes in face-to-face time with the patient, and in non-face-to-face activities, conducted today directly related to this video visit, including reviewing records and tests, obtaining history, performing examination, placing orders, communicating with other healthcare professionals, counseling the patient, family or caregiver, documenting in the medical record, and/or care coordination for the diagnoses above.    Gwendel Hanson, MD  Assistant Professor of Neurology  [place]

## 2021-01-16 ENCOUNTER — Ambulatory Visit: Admit: 2021-01-19 | Payer: PRIVATE HEALTH INSURANCE | Attending: Nurse Practitioner | Primary: Physician

## 2021-01-16 DIAGNOSIS — R39198 Other difficulties with micturition: Secondary | ICD-10-CM

## 2021-01-16 DIAGNOSIS — N9489 Other specified conditions associated with female genital organs and menstrual cycle: Secondary | ICD-10-CM

## 2021-01-16 DIAGNOSIS — M6289 Other specified disorders of muscle: Secondary | ICD-10-CM

## 2021-01-16 NOTE — H&P (Signed)
I performed this consultation using real-time Telehealth tools, including a live video connection between my location and the patient's location. Prior to initiating the consultation, I obtained informed verbal consent to perform this consultation using Telehealth tools and answered all the questions about the Telehealth interaction.  The available consultant for this service is Katrine Coho, MD.       Urology New Patient Note    Subjective    Ralph Allen is a 25 y.o. male who presents with the following:    Difficulty in urination    HISTORY OF PRESENT ILLNESS:    Ralph Allen was seen in the Urology faculty practice clinic at [facility] as a new patient in a consultation at the request of Dr. Thelma Comp, Holli Humbles, MD for evaluation and possible treatment of difficulty in urination , which has been ongoing since 2 years.     At this initial visit, Ralph Allen reports that for the past 2 years,he has been having difficulty starting urination associated with occasinal dribbling, urges, frequency and feelings of incomplete bladder emptying,  Nocturia 0.  Seen by urologist from [place] but now moved to [facility]  He was evaluated by uroflow showed showed emptied bladder  Still having the need to void  Unable to control  And needs to be relaxed.to urinate.  Referral to PT.was made while in previous state  He still has occasional frequent urination but, nocturia 0.  Has hesitancy, sometimes intermittency,  No dysuria,  Tried Alfuzosin 10 mg x month but did not help.  Has also constipation    . Ralph Allen denies any fever.      The patient's past medical, surgical, medication, allergy, family, and social histories were reviewed and noncontributory to this illness/condition except as noted below:    The patient is allergic to:   Allergies/Contraindications   Allergen Reactions    Tramadol Nausea And Vomiting       The patient is currently taking the following medications:  No outpatient medications have been marked as taking  for the 01/16/21 encounter (Appointment) with Freeman Caldron, NP.        The patient's past medical history is notable for:  No past medical history on file.    The patient's past surgical history is notable for:  Surgical History          No past surgical history on file.          The patient's past family  history is notable for:              The patient's past social  history is notable for:  Social History     Tobacco Use    Smoking status: Current Every Day Smoker     Types: Cigarettes, Cigars    Smokeless tobacco: Never Used   Substance and Sexual Activity    Alcohol use: Yes    Drug use: Not Currently     Types: Marijuana    Sexual activity: Not on file   Social History Narrative    Not on file         Review of systems was performed and reviewed today. It is notable for:     Constitutional: Negative for fever, chills, weight loss and malaise/fatigue.   HENT: Negative for hearing loss, neck pain and tinnitus.    Eyes: Negative for blurred vision, double vision and photophobia.   Respiratory: Negative for cough, sputum production and wheezing.    Cardiovascular: Negative for chest pain  and palpitations.   Gastrointestinal: Negative for heartburn, nausea, vomiting, abdominal pain, diarrhea and constipation.   Genitourinary: Negative for dysuria, urgency and frequency.   Musculoskeletal: Negative for back pain and joint pain.   Skin: Negative for itching and rash.   Neurological: Negative for dizziness, tingling, focal weakness and headaches.   Endo/Heme/Allergies: Does not bruise/bleed easily.   Psychiatric/Behavioral: Negative for depression. The patient is not nervous/anxious.      Objective      PHYSICAL EXAM:     REVIEW OF TESTING:    The following lab results were personally reviewed by me:       Lab Review  No results found for: WBC, WBCM, WCBD, HGB, HGBM, HCT, HCTM, NSH, HCT22, MCV, PLT, CCP   No results found for: CA   No results found for: NA, SB, NAWB, K, KSB, CL, CLWB, CO2, HCO3   No results  found for: CREATININE, CREAT, CWB, CREATI     Assessment and Plan      Problem Based Assessment and Plan   Mavrick Mcquigg is a(n) 25 y.o. male with history of difficulty in urination with prior urology evaluation.    High-tone pelvic floor dysfunction  - Uroflow and PVR  -     Ambulatory Referral to Physical Therapy; Future  -     Ambulatory Referral to Physical Therapy; Future      IMPRESSION:    It was a pleasure seeing Ralph Allen in clinic today.     I addressed pelvic floor dysfunction  with the patient today, which is a new problem for [place]. This issue is getting worse.     I discussed voiding dysfunction in young men which is benign condition and is most likely due to pelvic floor dysfunction resulting from high tone and failure to relax during voiding.  I discussed the conservative management and recommended physical therapy at this time.  He agreed.  I suggested a uroflow study for baseline evaluation.  I mentioned cystoscopy, PTNS and use of alpha blockers   Questions were addressed and the patient agreed with the plan.    PLAN:    The natural history as well as any appropriate treatment options were discussed with the patient today. Based on todays visit, we will plan for PT referral. Additional workup ordered today included none.      FOLLOW-UP: 4 weeks or earlier if symptoms change, fail to improve, worsen, or if any additional questions need to be answered.    The patient was instructed to call our clinic nursing line at [phone] or the main line at [phone] with any questions or go to nearest emergency room should there be any fevers, chills, nausea, vomiting, worsening pain, chest pain, or inability to void.    Thank you for allowing [place] to participate in the care of this patient. Please do not hesitate to contact us should you have any questions or concerns.           I spent a total of 30 minutes on this patient's care on the day of their visit excluding time spent  related to any billed procedures. This time includes time spent with the patient as well as time spent documenting in the medical record, reviewing patient's records and tests, obtaining history, placing orders, communicating with other healthcare professionals, counseling the patient, family, or caregiver, and/or care coordination for the diagnoses above.      Services provided by Freeman Caldron, NP on 01/16/2021 8:58 AM

## 2021-01-16 NOTE — Patient Instructions (Signed)
In summary, we discussed the management of high tone pelvic floor dysfunctionl.  You agreed with the plan listed, at this time. .    Plan  Uroflow and PVR in the clinic  Referral to physical therapy.  Future PTNS discussion.  Future medication trial of alpha blockers after the uroflow.  Future video urodynamic study, cystoscopy if all adjunct treatment fail.      Thank you for allowing [place] to be part of your health care.    Take care    Ralph Essex, NP-C        Trial of pelvic PT-- please review the list of therapist to find the office that best suits your needs.    Things to do:  1. Call the Pelvic Physical Therapy office to confirm insurance needs and schedule visit.  2. If there are any  remaining insurance questions (copays, re-imbursement)  please contact your insurancy company directly.  3. Notify our office 2 weeks prior to your physical therapy appointment so your records can be faxed in a timely manner.  4. If none of the listed Pelvic PTs are able to see you, please look at the APTA (American Physical Therapy Association) website:   [email or web address]     IF your Pelvic PT is for voiding dysfunction/pelvic pain please look for a Therapist who knows internal work and [place]     [address], [place] [address]    [facility]: [phone]  Fax: [phone] [email or web address]    Accepts Insurance    [facility]    Amy Selinger, PT, DPT    [facility], [email or web address]  [address], [address] (between [place] [address]  P: [phone]  F: [phone]    Seward Meth, PT,MSPT  [address] [place] [address]  [facility]: [phone]  Fax: [phone] [email or web address]      Male/Male   Pelvic Pain Rehab    [address]  [address] [place] [address]  [facility]: [phone] [email or web address]    Self-Pay, Owens Shark & Druscilla Brownie    Male/Male   Edilia Bo    [address]  [place] [address]  [facility]: [phone]    Physical Therapy Faculty Practice at [facility]    [address], [address]  [place] [address]  [facility]: [phone]  [phone] Monday to Thursday - 7 a.m. - 7 p.m.  Friday - 7 a.m. - 5:30 p.m.  Saturday - 8 a.m. - 4:30 p.m.       [facility]  Miachel Roux, PT, DPT   [facility] [place]  [address]  [place], [place]  Office: [phone]  Fax: [phone]  [email or web address]  [email or web address]    Pelvic Pain Rehab for pelvic PT in [facility]:  If you have any questions, please contact [facility] directly     Address and Location:  [place], [place] [address]  _________________________________________  [facility], [place] [address]  [facility]: [phone]        [facility]  Amy Selinger, PT, DPT  [facility] [email or web address]  [address] [place] [address]  P: [phone]  F: [phone]                 Office: [phone]              __________________________________        Self-Pay              [email or web address]    Male Cassie Freer, [place] [address]  [facility]: [phone] [email or web address]   Pelvic Pain Rehab    [address]  [place] [address]  [facility]: [phone]  Fax: [phone] [email or web address]    Self-Pay, [place], [place],  [place]  [address] [place] [address]  [facility]: [phone] [email or web address]     [place], [place] [address]  [place] [address]  [facility]: [phone]  Fax: [place], [place] [address]  [facility]: [place], [place] [address]    [facility]: [phone] Medi-Cal & Insurance    Male/Male   Alphonzo Dublin, MPT    [address] [place] [address]  [place]: [place], [place] [address]    [facility]: [phone]        Fax: [phone]                   [email or web address]  email: [email or web address]    Male/Male       [place], [place] [address]  [facility]: [place], [place] [address]  [facility]: [phone]   [email or web address]    PPO/[facility] & Self-Pay   Physical Therapy & Sports Fitness     [place], MPT  [address]  [place] [address]  [facility]: [phone]      [address]  [place] [address]  [facility]: [phone] [email or web address]            PPO/Sutter HMO   W/O Physician referral - Self- Pay  Male/Male   3 PT's  1 Arm & Hand PT    [address]   [place], [place] [address]  [facility]: [place]    Male/Male     Rob Hickman, PT    [address] [place] [address]  [facility]: [phone] Self-Pay & Accepts Insurance     P.O.S.T. Wellness by Design     Roena Malady, PT, [place], [place]  [address] [place] [address]  [facility]: [phone]  Fax: [phone] [email or web address] (questions & info)  [email or web address]   [email or web address] (scheduling)    Medicare & Self- Pay  Male/Male   Core  Physical Therapy  [place] [address] [place] [address]  [facility]: [phone] Accepts a wide range of health insurance    Male/Male     Northern[place]Coast  Carole [place], [place]    [facility]  [address] [place] [address]    [facility]   [address] [place] [address]   [facility]: [phone]  Fax: [phone] [email or web address]      [facility] PPO & Self-Pay   [facility]    [place], [place] [address]  [facility]: [phone]  Fax: [phone] [email or web address]      [place]    Male   Women's Physical Therapy    [address]  [place] [address]  [facility]: [phone]  Fax:[phone]      [facility]    Wilburn Cornelia, PT    [place] [address]  [facility]: [phone]  Fax: [phone] [email or web address]    Medicare & Physician Referral Required    Male    Male     [place]ierra  Pepin, PT,[place] [address]  [facility]: [phone]  Fax: [phone] [email or web address], DPT    [address] [place] [address]  [facility]: [phone]  Fax: [phone] [email or web address]        Acccepts Insurance  Male/Male & Pediatric   [facility]    Ernestene Kiel, PT     [address]  [place] [address]  [facility]: [phone]  Fax: [phone] [email or web address]    [address]linor [place], [place] [address]  [facility]: [phone]  Fax: [phone] [email or web address]      Accepts PPO & Jeryl [place]    Male/Male   [facility]     Jenna Luo, PT    [address] [place] [address]  [facility]: [phone]   [email or web address]    Medicare, [facility], [place], [place], [place]  [address]  [facility]: [phone]   [email or web address]    [facility]  Therapy Services    Eliseo Gum, PT  [place], [address]  [place] [address]  [facility]: [phone]  Fax: [phone] [email or web address]    Male/Male     [employer]    [place], [place] [address] [place] [address]  [facility]: [phone]  Fax: [phone]   Accepts Insurance & Self-Pay    Male/Male

## 2021-03-19 NOTE — Progress Notes (Deleted)
BOTULINUM TOXIN INJECTION  PROCEDURE NOTE    Re: Ralph Allen  MRN: [id number]  Date: 03/19/2021    Last visit brief summary 01/09/2021: Doing well at this time with Botox and PRN Rimegepant (use needs currently infrequent). Frequency ~10/30, with most being mild to moderate only. No changes to his tx plan today. To note, he was between PCPs and requested referral to Urology for known urinary frequency issues x years (prev saw someone in [place].). I placed this referral. Also, reported upcoming likely COVID19 booster shot. Given headache exacerbation with previous 2 shots, we discussed utilizing a Naproxen "Bridge" to help him through, as outlined in detail in the plan.    ***VS    Diagnosis: chronic migraine  Procedure: OnabotulinumtoxinA Injections (PREEMPT protocol)    The risks, benefits, and anticipated outcomes of the procedure, the risks and benefits of the alternatives to the procedure, and the roles and tasks of the personnel to be involved, were discussed with the patient, and the patient consents to the procedure and agrees to proceed. Written informed consent signed and submitted for scanning into the electronic medical record.    UNIVERSAL PROTOCOL / SAFETY CHECKLIST   Procedure to be performed: OnabotulinumtoxinA injections, PREEMPT protocol   Time Out: The correct patient, procedure, and site were confirmed at ***PM.    PROCEDURE    Treatment #9  Diluent: normal saline  Indication: chronic migraine     Injection SItes    Muscle Site & Dose Left  Right   corrugators 10 units, 2 sites 5  5   procerus 5 units, 1 site  5    frontalis 20 units, 4 sites 10  10   temporalis 40 units, 8 sites 20  20   occipitalis 30 units, 6 sites 15  15   cervical paraspinals 20 units, 4 sites 10  10   trapezius 30 units, 6 sites 15  15   additional (follow-the-pain)        TOTAL  155  units                      Total Units used: 155  Total Units wasted: 45    Patient tolerated the procedure without complications or significant  blood loss. Post procedure instructions were provided to the patient and all questions were answered.     Change in Treatment Plan? No    Lot: ***  Exp: ***      Follow up with myself in 8 weeks time to check in; then 12 weeks for Botox #10.    Robet Leu  Assistant Professor of Neurology  [place]

## 2021-03-26 ENCOUNTER — Ambulatory Visit
Admit: 2021-03-26 | Discharge: 2021-04-09 | Payer: Commercial Managed Care - PPO | Attending: Neurology | Primary: Physician

## 2021-03-26 DIAGNOSIS — G43709 Chronic migraine without aura, not intractable, without status migrainosus: Secondary | ICD-10-CM

## 2021-03-26 MED ORDER — ONABOTULINUMTOXINA 200 UNIT SOLUTION FOR INJECTION
200 | Freq: Once | INTRAMUSCULAR | Status: AC
Start: 2021-03-26 — End: 2021-03-26
  Administered 2021-03-26: 21:00:00 via INTRAMUSCULAR

## 2021-03-26 NOTE — Progress Notes (Signed)
BOTULINUM TOXIN INJECTION  PROCEDURE NOTE    Re: Ralph Allen  MRN: [id number]  Date: 03/26/2021    Last visit brief summary 01/09/2021: Doing well at this timewith Botoxand PRN Rimegepant (use needs currently infrequent). Frequency ~10/30, with most being mild to moderate only. No changes to tx plan today. To note, he was between PCPs and requested referral to Urology for known urinary frequency issues x years (prev saw someone in [place].). I placed this referral. Also, reported upcoming likely COVID19 booster shot. Given headache exacerbation with previous 2 shots, we discussed utilizing a Naproxen "Bridge" to help him through, as outlined in detail in the plan.    BP 114/67 (BP Location: Left upper arm, Patient Position: Sitting, Cuff Size: Adult)   Pulse 81   Temp 36.5 C (97.7 F) (Temporal)   Resp 20   Ht 175.3 cm (5\' 9" )   Wt 75.8 kg (167 lb)   BMI 24.66 kg/m      Diagnosis: chronic migraine  Procedure: OnabotulinumtoxinA Injections (PREEMPT protocol)    The risks, benefits, and anticipated outcomes of the procedure, the risks and benefits of the alternatives to the procedure, and the roles and tasks of the personnel to be involved, were discussed with the patient, and the patient consents to the procedure and agrees to proceed. Written informed consent signed and submitted for scanning into the electronic medical record.    UNIVERSAL PROTOCOL / SAFETY CHECKLIST   Procedure to be performed: OnabotulinumtoxinA injections, PREEMPT protocol   Time Out: The correct patient, procedure, and site were confirmed at 1:55PM.    PROCEDURE    Treatment #9  Diluent: normal saline  Indication: chronic migraine     Injection Sites    Muscle Site & Dose Left  Right   corrugators 10 units, 2 sites 5  5   procerus 5 units, 1 site  5    frontalis 20 units, 4 sites 10  10   temporalis 40 units, 8 sites 20  20   occipitalis 30 units, 6 sites 15  15   cervical paraspinals 20 units, 4 sites 10  10    trapezius 30 units, 6 sites 15  15   additional (follow-the-pain)        TOTAL  155  units                                                                                                                         Total Units used: 155  Total Units wasted: 45    Patient tolerated the procedure without complications or significant blood loss. Post procedure instructions were provided to the patient and all questions were answered.     Change in Treatment Plan? No    Lot: [id number]  Exp: 11/2023      Follow up with myself in 6-8 weeks time to check in; then 12 weeks for Botox #10.    Robet Leu  Assistant Professor of Neurology  [facility]

## 2021-05-19 ENCOUNTER — Ambulatory Visit: Admit: 2021-05-19 | Payer: PRIVATE HEALTH INSURANCE | Attending: Neurology | Primary: Physician

## 2021-05-19 DIAGNOSIS — G43709 Chronic migraine without aura, not intractable, without status migrainosus: Secondary | ICD-10-CM

## 2021-05-19 MED ORDER — TRIAMCINOLONE ACETONIDE 55 MCG NASAL SPRAY AEROSOL
55 | NASAL | Status: AC
Start: 2021-05-19 — End: ?

## 2021-05-19 MED ORDER — CETIRIZINE 10 MG TABLET
10 | ORAL | Status: AC
Start: 2021-05-19 — End: ?

## 2021-05-19 MED ORDER — NURTEC ODT 75 MG DISINTEGRATING TABLET
75 mg | ORAL_TABLET | ORAL | 2 refills | Status: DC
Start: 2021-05-19 — End: 2021-08-13
  Filled 2021-05-21: qty 16, 30d supply, fill #0

## 2021-05-19 NOTE — Telephone Encounter (Addendum)
[place]  Pharmacist Medication Management      Spoke with Patient who agreed to a comprehensive medication review.    Patient allergies reviewed:  Yes  a. Any onset of new allergies? None noted    Medication recall process reviewed with patient and verbalized understanding?  Yes    Does the patient have an emergency contact person?  Yes: reviewed in epic    Does the patient have any dietary restrictions? i.e. vegan, gluten-free, etc.  None noted    Does the patient have any functional limitations? i.e. hearing impaired, mobility constraints, etc.  None noted    Does the patient have any concerns in affording their medication?  None noted    Does patient have adequate living arrangements to adhere to therapy? i.e. homeless, no refrigerator, etc.  Yes    Is patient educated on emergency preparedness? Information is in our [employer] and pharmacy website under resources  Yes    For re-assessments only: Has patient missed any doses in the last 6 months?  Restarting Nurtec for prevention; pt had before and has recently been using Nurtec PRN    Patient's Goal of Therapy:  Patient agrees to goals of therapy: reduce migraine frequency and intensity      [facility]  Pharmacist Medication Reconciliation    Medication Discrepancies and Points of Clarification    Medications ON the Apex list that the patient is NOT taking:  naproxen    Medications NOT on the Apex list that the patient IS taking:  cetirizine; Nasacort spray    Medications ON the Apex list that the patient is taking DIFFERENTLY than ordered:  None noted    Drug-Drug interactions that were identified:  None noted      A/P  1. Restart Nurtec ODT 75mg  po every other day for prevention. Pt declined counseling; had before previously and is currently using Nurtec PRN. Pt denies any side effects or med issues. Applied copay card for $0 copay. Delivery address verified; include welcome packet  2. MIDAS/HIT6 sent via mychart  Completed via  mychart on 05/20/21   MIDAS = 112  HIT-6 = 65    Estimated total time spent: 15 to 20 minutes    Katy Apo, [facility]  [phone]

## 2021-05-19 NOTE — Telephone Encounter (Signed)
Specialty Pharmacy Financial Assistance Note    COPAY CARD INFO- Nurtec    BIN # [id number]  PCN # [id number]  GRP # [id number]  ID # [id number]    Ph# [phone]

## 2021-05-19 NOTE — Telephone Encounter (Signed)
PRIOR AUTHORIZATION    Rx: Nurtec ODT 75mg   PA: [employer] info: [employer]s  Plan ID: [id number]    Notes/Justification: pt tried and failed nortriptyline, propranolol, gabapentin, and botox. 12 migraine headache days per month.    [facility]  Specialty Pharmacy  Fax: [phone]  Pharmacy:[phone]

## 2021-05-19 NOTE — Patient Instructions (Signed)
Thank you for allowing me to participate in your care. I hope that you found your visit today helpful and that I have addressed most of your questions and concerns. MyChart is the best method of communication with me and my office. If you have questions about your condition, your treatment plan or medications, please contact us either by MyChart or by phone to our front office staff at [phone]. Also a friendly reminder that our fax number if again needed is [phone].    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 Relative set back intervally since last Botox 2 months ago in the setting of not regularly taking Rimegepant. Plan to restart this preventively, as the combination of this with Botox was previously very helpful to him. Botox #10 is scheduled already for 07/09/2021, however, this is 2 weeks delayed - will ask if able to instead see NP [name] for procedure the week of 06/18/2021.    RECOMMENDATIONS:  Recommend diagnostic testing:  At this point, agree with Neurosurgery (Dr. [name]) plans for 1-year follow up surveillance MRI Brain w/wo Contrast (falling ~08/2021)  Prophylactic treatment:  Continue Botulinum toxin PREEMPT Protocol injections - already scheduled for Botox #10 07/09/2021 (although this is to be 2 weeks late than previous)  START  Rimegepant (Nurtec ODT =an oral CGRP blocking small molecule) 75mg  daily using every other day for the prevention of migraine.  Additionally may use acutely another time, technically up to 16 uses per month.  Future considerations:  Memantine  Neutraceuticals  Cefaly Device (preventive setting)  CGRP mABs: Emgality, Aimovig, Ajovy, Vyepti  Gepants (preventively): Rimegepant, Atogepant  SNRIs: Venlafaxine, Duloxetine  Other GABAs: Pregabalin  AEDs: Lamotrigine, Topiramate, Depakote, Zonisamide  Other Beta Blockers: Atenolol, Metoprolol  Anti-hypertensives: Candesartan, Lisinopril, Amlodipine, Verapamil  Other TCAs: Amitriptyline  Cyproheptadine  Acute treatments:  As above, Rimegepant 75mg  daily as needed for headache spikes up to 16 uses per month.  Can continue over-the-counter non-steroidal anti-inflammatory medications (NSAIDs) such as Ibuprofen 600-800mg , Naproxen 220-440mg , or Tylenol 1000-2000mg  for acute headache  You may even choose to combine this with your GEPANT for hopeful improved (synergistic) effect. We would call this "double acute therapy"  Cautious to LIMIT total monthly NSAID use to on no more than 12 days per month, so as to avoid the development of "rebound" headaches (or medication-overuse headache/MOH)   Future considerations:  Anti-emetics  Other NSAIDs: Diclofenac, Toradol  Other Gepants: Ubrogepant  Other Triptans, namely longer-acting ones (Frova, Nara) to try to mitigate side effects  Cefaly Device (acute setting)  Nerivio Migra Device  Hydroxyzine  At-home SPG blockade  Non-pharmacological approaches:  Headache lifestyle modifications always ongoing. The biggest areas of focus for you seem to include increased hydration; ongoing regular exercise, regular sleep habits, stress reduction/coping strategies  Future considerations:  Headache medicine procedures such as nerve blocks, trigger point injections (TPIs), or sphenopalatine ganglion (SPG) blocks  Follow up with Dr. [name]  (PCP) for ongoing coordination of care  Follow up with myself in 07/09/2021 for Botox #10 - already scheduled but 2 weeks delayed. Will ask if openings week of 48/18/5631 with NP [name] versus if able to add-on 06/25/2021 to my schedule.    The patient understood that, as a tertiary specialty clinic, this was a consultation visit only. That we ask that the patient work with his PCP to consider and ultimately decide whether to implement the following recommendations. The PCP is more familiar with the patient's other health conditions and psychosocial concerns. Having medications managed by a single provider also limits duplication and risk of interactions. This time I will prescribe: Rimegepant (re-authorization required and is a headache-specific medication).      WHEN TO CONTACT YOUR HEADACHE SPECIALIST  - If you have a question about a treatment recommended during a visit.  - If you think you are experiencing side effects from a medication prescribed by the headache specialist.   - If you are unsure how to take a medication that was prescribed by the headache specialist.  - If you are providing information about test results or records requested.  - If you would like to provide an update about inpatient admission.  - If you would like to request a procedure recommended to you.    If you are interested in being a part of Headache Research at [name], please let me know via MyChart message and I will put our research coordinator in touch with you.

## 2021-05-19 NOTE — Progress Notes (Signed)
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    I performed this consultation using real-time Telehealth tools, including a live video connection between my location and the patient's location. Prior to initiating the consultation, I obtained informed verbal consent to perform this consultation using Telehealth tools and answered all questions about the Telehealth interaction.    Brief Headache History: [name]'s headaches began around 8th grade. Following a couple of concussions with worsening headaches he eventually underwent brain imaging and was found to have a brain tumor and hydrocephalus (age 25) s/p biopsies (confirmed low-grade astrocytoma; on imaging monitor; no resection/RT/Chemo to date) s/p ETV with improvement in headaches for some time. 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 At his worst, reports frequency was near-daily (27/30 monthly days). He had tried various treatments and has had the most success with Botox PREEMPT protocol since early 2020. He moved to [facility] ~06/2020 and established care with Dr. Ennis Forts at our [facility] just prior (04/22/2020), where it was decided they would continue Botox for tx of chronic migraine. He formally presented to transition care to myself 01/09/2021, reporting:  FREQUENCY: ~10/30 SEVERITY: 3-4/10 average with spikes to >5/10 on <1-1 days LOCATION: bilateral frontal, sinus, occasional temporal and vertex CHARACTER: pressure-like, throbbing DURATION: a couple of hours ASSOC  MIGRAINE SXS: photophobia, phonophobia, osmophobia, nausea (rare), brain fog, lightheadedness, scalp allodynia PRODROME: "a sense that headaches are coming", tingling in the head, disorientation AURA: Denies CRANIAL AUTO SXS: lacrimation, +occasional agitation TRIGGERS: stress, stress let-down, undersleeping, alcohol, bending forward ALLEVIATING FACTORS: distraction ATYPICAL SXS: sometimes numbness on "both sides of face", cheeks; +tinnitus (occasional, brief <5 sec, not always headache-associated, bilateral, high-pitch, non-pulsatile); "periods of vertigo"  There are no positional features to the headaches including change with upright, lying down, valsalva, cough/sneeze; however, bending over is a big trigger for him (->lightheadednes and pain) - chronically so; further, there is no pulsatile tinnitus, diplopia, or TVOs.    INTERVAL HISTORY & EVENTS since our last visit on 03/26/2021 (for Botox #9):  Since last visit things have been slightly worse. Current frequency is now estimated at 3x/week =12/30 (prev 10/30) headache days; with 12/12 being severe (prev 0/10).    Notes he did stop regularly taking Rimegepant every other day (preventively) a few months ago - as was doing so well! This may be the reason for relative worsening. He also has not been eating as "clean". He is also back in the office for work.     Notes otherwise that his caffeine intake is unchanged, if not overall down. He has been getting decent sleep on a somewhat regular basis (although his hours are unpredictable at work).    There is nothing new or different about the headache features or phenomenology.    VISIT HISTORY:  - 04/22/2020 (Dr. Irene Shipper): Initial consultation - dx chronic migraine. Planned to continue Botox PREEMPT Protocol.  - 90/24/0973 (NP [name]): Botox ~#5-3 (of life)  - 12/18/2020: *1st visit with myself* Botox ~#8 (of life)  - 01/09/2021: Doing well at this timewith Botox and  PRNRimegepant (use needs currently  infrequent). Frequency ~10/30, with most being mild to moderate only.No changes totxplan today. Noted, he was between PCPs and requestedreferral to Urology for known urinary frequency issues x years (prev saw someone in [place].). I placedthis referral. Also, reported upcoming likely COVID19 booster shot. Given headache exacerbation with previous 2 shots, we discussed utilizinga Naproxen "Bridge" to help him through, as outlined in detailin the plan.  - 03/26/2021: Botox #9  - 05/19/2021: Today's Televisit, as above.    REVIEW OF SYSTEMS:    There were headache and related neurological and constitutional symptoms, as noted above in the HPI. All other systems were reviewed and are negative.    Focused Headache Review of Systems:  Caffeine Does not really drink caffeine; only occasional soda  Meals Tends to eat smaller, more frequent (5) meals/day - finds this helpful for his headaches  Hydration Drinks some H2O/day; this is an area for improvement  Exercise "A fair amount"; [employer], walking, golf  Sleep Goes to bed at 11PM, wakes up at 8:30-9AM. Denies issues falling asleep; Denies issues staying asleep. Denies daytime fatigue.  Mood Doing well  Prior psychiatric diagnosis H/o adjustment disorder after his brain tumor dx  Prior neck/head trauma Several head injuries, mTBIs/concussions in life, with headaches worsening each time  Neck symptoms "I hold tension in my neck"    PMH/PSH  1. Headaches  2. Brain tumor - dx age 25 on imaging; s/p 2 biopsies in 2014, biopsy-proven pilocystic astrocytoma (low-grade) dx 12/13/2012 c/b hydrocephalus s/p 3rd ventriculostomy; stable on imaging monitoring last 08/13/2020; no resection or RT to date; established with [facility] (Dr. [name]), with plans for 1-year follow up MRI (~08/2021)  3. 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 H/o hypertension - "low resting blood pressure" but it spikes up at M.D. offices; does  have notable history of HTN in mother  81. IBS - mainly constipation  6. Urinary frequency - has seen Urology for this back in [place].; saw [facility] 01/16/2021 and rec'd Uroflow study for baseline evaluation of suspected pelvic floor dysfunction resulting from high tone and failure to relax during voiding  7. Broken nose s/p surgical repair  8. Sinus problems - "ears pop constantly"  9. 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    [place]:  -Rimegepant 75mg  PRN headache - not using (prev 1-2/30), reduces severity, no SEs   -Ibuprofen PRN headache - not using (prev 0/30), occasionally helpful, no SEs  -Botulinum toxin injections PREEMPT Protocol - since early 2020, LD 03/26/2021, effective to reduce frequency and severity of headaches, no SEs  Other current medications reviewed:  -Albuterol PRN  -Nasacort PRN  -Zyrtec PRN  -MVI daily  -Vitamin C daily  -Vitamin D daily    ---------------------------------------------------------------------------------------  PAST PREVENTIVE TRIALS:  -Melatonin  -Nortriptyline   -Gabapentin - initially helpful but headaches returned after 9 months, no SEs  -Propranolol  -Botox (as above)  PAST ACUTE TREATMENT TRIALS:  -Sumatriptan - no effect, ->"want to throw up"  -Rizatriptan ->throw up  -Rimegepant (as above)   -Ibuprofen (as above)     PAST NON-PHARMACOLOGICAL AND PROCEDURAL  TRIALS:  -Denies  ---------------------------------------------------------------------------------------    ALLERGIES:  Tramadol ->nausea/vomiting    EXAMINATION:  General exam:  Well-appearing and in no acute distress. No dysmorphic features. Well-groomed.   Musculoskeletal: Head is normocephalic, atraumatic, scalp is clear. Neck is supple, with good range of motion; Spurling's sign was negative.  HEENT: Oropharynx is clear, mucous membranes appear moist; eyes were non-icteric and sclera not injected.  Pulmonary: Breathing comfortably  Cardiovascular: No peripheral edema  Abdomen: non-distended and non-tender  Extremities: normal in appearance   Skin: no rashes  Neurological Exam:  Mental Status: Fully alert and oriented to person, place, and time. Fluent speech. Affect was normal.   Cranial Nerves: Pupils are equal, visual fields are full to video exam. There is no ptosis. Extraocular movements are intact without nystagmus, facial strength and sensation are symmetric, hearing seems normal, tongue movement looks normal; speech is clear with no dysarthria, shoulder shrugs are full.  Motor: no tremors noted, pronator drift not present, fine finger taps are rapid and symmetric.   Sensation: intact to light touch throughout  Coordination: no axial instability. Accurate finger to nose bilaterally.  Gait: deferred at this visit    INTERVAL DIAGNOSTIC TESTING:  None    Previous relevant diagnostics to note:  Last MR Brain w/wo Contrast 08/13/2020 (obtained for tumor surveillance): IMPRESSION:   "1. Since 07/16/2019, no tumor progression. Unchanged exophytic 31mm round enhancing nodule centered in the right medial thalamus/superior colliculus, compatible with reported biopsy proven pilocystic astrocytoma.  2. Unchanged nonenhancing FLAIR hyperintense signal in the right globus pallidus, right amygdala, bilateral thalamic pulvinar nuclei, and left caudothalamic groove, suspicious for dysplastic or hamartomatous  lesions."    Other MRI Brains:  -w/wo Contrast  08/04/2018: "FINDINGS: There is an unchanged appearance to the expansile mass involving the right superior colliculus demonstrating hazy increased T2/FLAIR. The central round enhancement measuring 6 mm is also stable. Narrowing of the adjacent cerebral aqueduct is without significant interval change. Mild prominence of the lateral ventricles is also stable. Bifrontal ventricular shunt catheter tracts are again identified.   There is no restricted diffusion to suggest acute infarct. A Chiari type I malformation is again seen, unchanged. No intracranial hemorrhage is visualized. Basal cisterns remain patent. The major intracranial flow-voids are unremarkable.  IMPRESSION: No significant interval change. Unchanged expansile T2 and FLAIR hyperintense lesion involving the right superior colliculus with unchanged 6 mm focus of enhancement."    CT Head w/o Contrast 03/27/2020: IMPRESSION:  "1. Cerebellar tonsils extending 8 9 mm beyond the foramen magnum with prominence of the ventricles, which is concerning for Chiari I malformation."    ASSESSMENT: Stefanie Libel) is a 25 year old right handed man with IBS, urinary frequency (following with [facility]), sinus problems, h/o multiple mTBIs/concussions, known low-grade bx-proven pilocytic astrocytoma (stable on imaging monitoring since) dx 12/2012 c/b hydrocephalus s/p ETV with subsequent improvement in headaches at that time, however a return of chronic headaches a few years later (during college) with headache dxs of:  -Chronic migraine without aura  -H/o headache attributed to intracranial neoplasia and hydrocephalus (s/p ETV in 2014)  -Likely 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 Relative set back intervally since last Botox 2 months ago in the setting of not regularly taking Rimegepant. Plan  to restart this preventively, as the combination of this with Botox was previously very helpful  to him. Botox #10 is scheduled already for 07/09/2021, however, this is 2 weeks delayed - will ask if able to instead see NP [name] for procedure the week of 06/18/2021.    RECOMMENDATIONS:  Recommend diagnostic testing:  1. At this point, agree with Neurosurgery (Dr. [name]) plans for 1-year follow up surveillance MRI Brain w/wo Contrast (falling ~08/2021)  Prophylactic treatment:  1. Continue Botulinum toxin PREEMPT Protocol injections - already scheduled for Botox #10 07/09/2021 (although this is to be 2 weeks late than previous)  2. START Rimegepant (Nurtec ODT =an oral CGRP blocking small molecule) 75mg  daily using every other day for the prevention of migraine.  a. Additionally may use acutely another time, technically up to 16 uses per month.  3. Future considerations:  a. Memantine  b. Neutraceuticals  c. Cefaly Device (preventive setting)  d. CGRP mABs: Emgality, Aimovig, Ajovy, Vyepti  e. Gepants (preventively): Rimegepant, Atogepant  f. SNRIs: Venlafaxine, Duloxetine  g. Other GABAs: Pregabalin  h. AEDs: Lamotrigine, Topiramate, Depakote, Zonisamide  i. Other Beta Blockers: Atenolol, Metoprolol  j. Anti-hypertensives: Candesartan, Lisinopril, Amlodipine, Verapamil  k. Other TCAs: Amitriptyline  l. Cyproheptadine  Acute treatments:  1. As above, Rimegepant 75mg  daily as needed for headache spikes up to 16 uses per month.  2. Can continue over-the-counter non-steroidal anti-inflammatory medications (NSAIDs) such as Ibuprofen 600-800mg , Naproxen 220-440mg , or Tylenol 1000-2000mg  for acute headache  a. You may even choose to combine this with your GEPANT for hopeful improved (synergistic) effect. We would call this "double acute therapy"  b. Cautious to LIMIT total monthly NSAID use to on no more than 12 days per month, so as to avoid the development of "rebound" headaches (or medication-overuse headache/MOH)   3. Future considerations:  a. Anti-emetics  b. Other NSAIDs: Diclofenac,  Toradol  c. Other Gepants: Ubrogepant  d. Other Triptans, namely longer-acting ones (Frova, Nara) to try to mitigate side effects  e. Cefaly Device (acute setting)  f. Nerivio Migra Device  g. Hydroxyzine  h. At-home SPG blockade  Non-pharmacological approaches:  1. Headache lifestyle modifications always ongoing. The biggest areas of focus for you seem to include increased hydration; ongoing regular exercise, regular sleep habits, stress reduction/coping strategies  2. Future considerations:  a. Headache medicine procedures such as nerve blocks, trigger point injections (TPIs), or sphenopalatine ganglion (SPG) blocks    Follow up with Dr. [name] (PCP) for ongoing coordination of care  Follow up with myself in 07/09/2021 for Botox #10 - already scheduled but 2 weeks delayed. Will ask if openings week of 27/25/3664 with NP [name] versus if able to add-on 06/25/2021 to my schedule.    The patient understood that, as a tertiary specialty clinic, this was a consultation visit only. That we ask that the patient work with his PCP to consider and ultimately decide whether to implement the following recommendations. The PCP is more familiar with the patient's other health conditions and psychosocial concerns. Having medications managed by a single provider also limits duplication and risk of interactions. This time I will prescribe: Rimegepant (re-authorization required and is a headache-specific medication).    I spent a total of 45 minutes in face-to-face time with the patient, and in non-face-to-face activities, conducted today directly related to this video visit, including reviewing records and tests, obtaining history, performing examination, placing orders, communicating with other healthcare professionals, counseling the patient, family or caregiver, documenting  in the medical record, and/or care coordination for the diagnoses above.    Gwendel Hanson, MD  Assistant Professor of Neurology  [place]

## 2021-05-19 NOTE — Telephone Encounter (Signed)
PRIOR AUTHORIZATION STATUS UPDATE    Rx: Nurtec ODT 75mg     PA Status: Approved  PA Case #: [id number]  [id number]   Start Date of PA: 05/19/21  End Date of PA: 05/19/22  # of Refills Authorized: 1 year    Pharmacy: [facility] Phone Number: [phone]  Please send refills to [facility] for insurance benefits investigation.    Insurance info: [employer]  Member ID#: [id number]  Copay: $30.00    Note: [facility] will notify patient and coordinate medication delivery.      [facility]  Fax: [phone]  Phone: [phone]

## 2021-05-21 NOTE — Telephone Encounter (Signed)
Reviewed Nurtec (rimegepant) with patient and med rec completed.    MIDAS = 112  HIT-6 = 65  (Escalation to clinic at HIT-6 >49 or MIDAS >10)    Patient HIT/MIDAS score above threshold and declined clinic escalation to [place] [facility].    Patient is actively keeping a headache journal and sharing with her provider.

## 2021-06-18 MED FILL — RIMEGEPANT 75 MG DISINTEGRATING TABLET: 75 mg | ORAL | 30 days supply | Qty: 16 | Fill #1 | Status: CP

## 2021-07-09 ENCOUNTER — Ambulatory Visit
Admit: 2021-07-09 | Discharge: 2021-07-20 | Payer: Commercial Managed Care - PPO | Attending: Neurology | Primary: Physician

## 2021-07-09 DIAGNOSIS — G43709 Chronic migraine without aura, not intractable, without status migrainosus: Secondary | ICD-10-CM

## 2021-07-09 MED ORDER — ONABOTULINUMTOXINA 200 UNIT SOLUTION FOR INJECTION
200 | Freq: Once | INTRAMUSCULAR | Status: AC
Start: 2021-07-09 — End: 2021-07-09
  Administered 2021-07-09: 21:00:00 155 [IU] via INTRAMUSCULAR

## 2021-07-09 NOTE — Progress Notes (Unsigned)
BOTULINUM TOXIN INJECTION  PROCEDURE NOTE    Re: Ralph Allen  MRN: [id number]  Date: 07/09/2021    Last visit brief summary 05/19/2021: Relative set back intervally since last Botox 2 months ago in the setting of not regularly taking Rimegepant. Planned to restart this preventively, as the combination of this with Botox was previously very helpful to him. Botox #10 is scheduled already for 07/09/2021, however, this is 2 weeks delayed - asked if able to instead see NP [name] for procedure the week of 06/18/2021, however, we did not have the clinic capacity to see him sooner.    ***    Diagnosis: chronic migraine  Procedure: OnabotulinumtoxinA Injections (PREEMPT protocol)    The risks, benefits, and anticipated outcomes of the procedure, the risks and benefits of the alternatives to the procedure, and the roles and tasks of the personnel to be involved, were discussed with the patient, and the patient consents to the procedure and agrees to proceed. Written informed consent signed and submitted for scanning into the electronic medical record.    UNIVERSAL PROTOCOL / SAFETY CHECKLIST   Procedure to be performed: OnabotulinumtoxinA injections, PREEMPT protocol   Time Out: The correct patient, procedure, and site were confirmed at ***PM.    PROCEDURE    Treatment #10  Diluent: normal saline  Indication: chronic migraine     Injection Sites    Muscle Site & Dose Left  Right   corrugators 10 units, 2 sites 5  5   procerus 5 units, 1 site  5    frontalis 20 units, 4 sites 10  10   temporalis 40 units, 8 sites 20  20   occipitalis 30 units, 6 sites 15  15   cervical paraspinals 20 units, 4 sites 10  10   trapezius 30 units, 6 sites 15  15   additional (follow-the-pain)        TOTAL  155  units                                                                                                                         Total Units used: 155  Total Units wasted: 45    Patient tolerated the procedure  without complications or significant blood loss. Post procedure instructions were provided to the patient and all questions were answered.     Change in Treatment Plan? No    Lot: ***  Exp: ***    I spent a total of 10 minutes in face-to-face time with the patient, and in non-face-to-face activities, conducted today directly related to this visit, including reviewing records and tests, obtaining history, performing examination, placing orders, communicating with other healthcare professionals, counseling the patient, family or caregiver, documenting in the medical record, and/or care coordination for the diagnoses above. This is independent of the time spent on the procedure performed today.    Follow up with myself in 6 weeks time to check in; then 12 weeks for Botox #11.  Dava Najjar  Assistant Professor of Neurology  [facility]

## 2021-07-16 MED FILL — RIMEGEPANT 75 MG DISINTEGRATING TABLET: 75 mg | ORAL | 30 days supply | Qty: 16 | Fill #2 | Status: CP

## 2021-08-10 NOTE — Telephone Encounter (Signed)
--  08/10/21 3:43 PM--    MyChart msg sent to pt

## 2021-08-13 ENCOUNTER — Other Ambulatory Visit (INDEPENDENT_AMBULATORY_CARE_PROVIDER_SITE_OTHER): Payer: Self-pay | Admitting: Family Medicine

## 2021-08-21 ENCOUNTER — Ambulatory Visit: Admit: 2021-08-21 | Payer: PRIVATE HEALTH INSURANCE | Attending: Neurology | Primary: Physician

## 2021-08-21 DIAGNOSIS — G43709 Chronic migraine without aura, not intractable, without status migrainosus: Secondary | ICD-10-CM

## 2021-08-21 NOTE — Telephone Encounter (Signed)
Jeanella Cara Rx sent to [facility] pharmacy as recommended by Dr. Thelma Comp. Pt notified via [place].

## 2021-08-21 NOTE — Progress Notes (Signed)
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    I performed this consultation using real-time Telehealth tools, including a live video connection between my location and the patient's location. Prior to initiating the consultation, I obtained informed verbal consent to perform this consultation using Telehealth tools and answered all questions about the Telehealth interaction.    Brief Headache History: Ralph Allen's headaches began around 8th grade. Following a couple of concussions with worsening headaches he eventually underwent brain imaging and was found to have a brain tumor and hydrocephalus (age 25) s/p biopsies (confirmed low-grade astrocytoma; on imaging monitor; no resection/RT/Chemo to date) s/p ETV with improvement in headaches for some time. 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 At his worst, reports frequency was near-daily (27/30 monthly days). He had tried various treatments and has had the most success with Botox PREEMPT protocol since early 2020. He moved to [facility] ~06/2020 and established care with Ralph Allen at our [facility] just prior (04/22/2020), where it was decided they would continue Botox for tx of chronic migraine. He formally presented to transition care to myself 01/09/2021, reporting:  FREQUENCY: ~10/30 SEVERITY: 3-4/10 average with spikes to >5/10 on <1-1 days LOCATION: bilateral frontal, sinus, occasional temporal and vertex CHARACTER: pressure-like, throbbing DURATION: a couple of hours ASSOC  MIGRAINE SXS: photophobia, phonophobia, osmophobia, nausea (rare), brain fog, lightheadedness, scalp allodynia PRODROME: "a sense that headaches are coming", tingling in the head, disorientation AURA: Denies CRANIAL AUTO SXS: lacrimation, +occasional agitation TRIGGERS: stress, stress let-down, undersleeping, alcohol, bending forward ALLEVIATING FACTORS: distraction ATYPICAL SXS: sometimes numbness on "both sides of face", cheeks; +tinnitus (occasional, brief <5 sec, not always headache-associated, bilateral, high-pitch, non-pulsatile); "periods of vertigo"  There are no positional features to the headaches including change with upright, lying down, valsalva, cough/sneeze; however, bending over is a big trigger for him (->lightheadednes and pain) - chronically so; further, there is no pulsatile tinnitus, diplopia, or TVOs.    INTERVAL HISTORY & EVENTS since our last visit on 07/09/2021 (for Botox #10):  Since last visit things have been okay. He did, notably, have a couple of weeks where headaches picked up decently, without obvious triggers. He did re-start regularly taking Rimegepant every other day (preventively), which has helped severity.    Current frequency is estimated at 12-18/30 (most prev 12/30; prev 10/30) headache days; with not many of them being severe (most prev 12/12; prev 0/10).    There is nothing new or different about the headache features or phenomenology.    VISIT HISTORY:  - 04/22/2020 (Ralph Allen): Initial consultation - dx chronic migraine. Planned to continue Botox PREEMPT Protocol.  - [id number] (Ralph Allen): Botox [id number] (of life)  - 12/18/2020: *1st visit with myself* Botox ~#8 (of life)  - 01/09/2021: Doing well at this timewith Botox and PRNRimegepant (use needs currently infrequent). Frequency ~10/30, with most being mild to moderate only.No changes totxplan today. Noted, he was between PCPs and requestedreferral to Urology for known urinary frequency issues x years (prev saw  someone in [place].). I placedthis referral. Also, reported  upcoming likely COVID19 booster shot. Given headache exacerbation with previous 2 shots, we discussed utilizinga Naproxen "Bridge" to help him through, as outlined in detailin the plan.  - 03/26/2021: Botox #9  - 05/19/2021: Relative set back intervally since last Botox 2 months ago i/s/o not regularly taking Rimegepant. Planned to restart this preventively, as the combination of this with Botox was prev very helpful to him. Botox #10 was scheduled already for 07/09/2021, however, this is 2 weeks delayed - asked if able to instead see Ralph Allen for procedure the week of 06/18/2021, however, we did not have the clinic capacity to see him sooner.  - 07/09/2021: Botox #10 (2 weeks delayed)  - 08/21/2021: Today's Televisit, as above.    REVIEW OF SYSTEMS:    There were headache and related neurological and constitutional symptoms, as noted above in the HPI. All other systems were reviewed and are negative.    Focused Headache Review of Systems:  Caffeine Does not really drink caffeine; only occasional soda  Meals Tends to eat smaller, more frequent (5) meals/day - finds this helpful for his headaches  Hydration Drinks some H2O/day; this is an area for improvement  Exercise "A fair amount"; Pel[employer], walking, golf  Sleep Goes to bed at 11PM, wakes up at 8:30-9AM. Denies issues falling asleep; Denies issues staying asleep. Denies daytime fatigue.  Mood Doing well  Prior psychiatric diagnosis H/o adjustment disorder after his brain tumor dx  Prior neck/head trauma Several head injuries, mTBIs/concussions in life, with headaches worsening each time  Neck symptoms "I hold tension in my neck"    PMH/PSH  1. Headaches  2. Brain tumor - dx age 25 on imaging; s/p 2 biopsies in 2014, biopsy-proven pilocystic astrocytoma (low-grade) dx 12/13/2012 c/b hydrocephalus s/p 3rd ventriculostomy; stable on imaging monitoring last 08/13/2020; no resection or RT to date; established  with [facility] (Dr. [name]), with plans for 1-year follow up MRI (~08/2021)  3. 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 H/o hypertension - "low resting blood pressure" but it spikes up at M.D. offices; does have notable history of HTN in mother  540 IBS - mainly constipation  6. Urinary frequency - [place] [facility]16/2021 and rec'd Uroflow study for baseline evaluation of suspected pelvic floor dysfunction resulting from high tone and failure to relax during voiding  7. Broken nose s/p surgical repair  8. Sinus problems - "ears pop constantly"  9. 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    [place]  -Ibuprofen PRN headache - still not using, occasionally helpful, no SEs  -Botulinum toxin injections PREEMPT Protocol - since early 2020, LD 07/09/2021, effective to reduce frequency and severity of headaches, no SEs  -Rimegepant '75mg'$  every other day - consistently again since 06/2021, reduces severity, no SEs   Other current medications reviewed:  -Albuterol PRN  -Nasacort PRN  -Zyrtec PRN  -MVI daily  -Vitamin C  daily  -Vitamin D daily    ---------------------------------------------------------------------------------------  PAST PREVENTIVE TRIALS:  -Melatonin  -Nortriptyline   -Gabapentin - initially helpful but headaches returned after 9 months, no SEs  -Propranolol  -Botox (as above)   -Rimegepant (as  above)     PAST ACUTE TREATMENT TRIALS:  -Sumatriptan - no effect, ->"want to throw up"  -Rizatriptan ->throw up  -Ibuprofen (as above)     PAST NON-PHARMACOLOGICAL AND PROCEDURAL TRIALS:  -Denies  ---------------------------------------------------------------------------------------    ALLERGIES:  Tramadol ->nausea/vomiting    EXAMINATION:  General exam:  Well-appearing and in no acute distress. No dysmorphic features. Well-groomed.   Musculoskeletal: Head is normocephalic, atraumatic, scalp is clear. Neck is supple, with good range of motion; Spurling's sign was negative.  HEENT: Oropharynx is clear, mucous membranes appear moist; eyes were non-icteric and sclera not injected.  Pulmonary: Breathing comfortably  Cardiovascular: No peripheral edema  Abdomen: non-distended and non-tender  Extremities: normal in appearance   Skin: no rashes  Neurological Exam:  Mental Status: Fully alert and oriented to person, place, and time. Fluent speech. Affect was normal.   Cranial Nerves: Pupils are equal, visual fields are full to video exam. There is no ptosis. Extraocular movements are intact without nystagmus, facial strength and sensation are symmetric, hearing seems normal, tongue movement looks normal; speech is clear with no dysarthria, shoulder shrugs are full.  Motor: no tremors noted, pronator drift not present, fine finger taps are rapid and symmetric.   Sensation: intact to light touch throughout  Coordination: no axial instability. Accurate finger to nose bilaterally.  Gait: deferred at this visit    INTERVAL DIAGNOSTIC TESTING:  None    Previous relevant diagnostics to note:  Last MR Brain w/wo Contrast 08/13/2020 (obtained for tumor surveillance): IMPRESSION:   "1. Since 07/16/2019, no tumor progression. Unchanged exophytic 30m round enhancing nodule centered in the right medial thalamus/superior colliculus, compatible with reported biopsy proven pilocystic astrocytoma.  2. Unchanged nonenhancing FLAIR  hyperintense signal in the right globus pallidus, right amygdala, bilateral thalamic pulvinar nuclei, and left caudothalamic groove, suspicious for dysplastic or hamartomatous lesions."    Other MRI Brains:  -w/wo Contrast  08/04/2018: "FINDINGS: There is an unchanged appearance to the expansile mass involving the right superior colliculus demonstrating hazy increased T2/FLAIR. The central round enhancement measuring 6 mm is also stable. Narrowing of the adjacent cerebral aqueduct is without significant interval change. Mild prominence of the lateral ventricles is also stable. Bifrontal ventricular shunt catheter tracts are again identified.   There is no restricted diffusion to suggest acute infarct. A Chiari type I malformation is again seen, unchanged. No intracranial hemorrhage is visualized. Basal cisterns remain patent. The major intracranial flow-voids are unremarkable.  IMPRESSION: No significant interval change. Unchanged expansile T2 and FLAIR hyperintense lesion involving the right superior colliculus with unchanged 6 mm focus of enhancement."    CT Head w/o Contrast 03/27/2020: IMPRESSION:  "1. Cerebellar tonsils extending 8 9 mm beyond the foramen magnum with prominence of the ventricles, which is concerning for Chiari I malformation."    ASSESSMENT: JColter(Barnabas Lister is a 25year old right handed man with IBS, urinary frequency (following with [facility]), sinus problems, h/o multiple mTBIs/concussions, known low-grade bx-proven pilocytic astrocytoma (stable on imaging monitoring since) dx 12/2012 c/b hydrocephalus s/p ETV with subsequent improvement in headaches at that time, however a return of chronic headaches a few years later (during college) with headache dxs of:  -Chronic migraine without aura  -H/o headache attributed to intracranial neoplasia  and hydrocephalus (s/p ETV in 2014)  -Likely 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 We discussed considering adding Magnesium Oxide '400mg'$  daily, if he'd like, to see if further additive to prevention. Acutely, will offer the [facility] Device to utilize when needed for pain spikes (even milder ones, which he has historically not treated pharmacologically).    RECOMMENDATIONS:  Recommend diagnostic testing:  1. At this point, agree with Neurosurgery (Dr. [name]) plans for 1-year follow up surveillance MRI Brain w/wo Contrast (falling ~08/2021)  Prophylactic treatment:  1. Continue Botulinum toxin PREEMPT Protocol injections - already scheduled for Botox #11 10/01/2021  2. Continue Rimegepant (Nurtec ODT =an oral CGRP blocking small molecule) '75mg'$  daily using every other day for the prevention of migraine.  a. Additionally may use acutely another time, technically up to 16 uses per month.  3. Consider ADDING Magnesium Oxide '400mg'$  nightly  4. Future considerations:  a. Memantine  b. Neutraceuticals  c. Cefaly Device (preventive setting)  d. CGRP mABs: Emgality, Aimovig, Ajovy, Vyepti  e. Gepants (preventively): Rimegepant, Atogepant  f. SNRIs: Venlafaxine, Duloxetine  g. Other GABAs: Pregabalin  h. AEDs: Lamotrigine, Topiramate, Depakote, Zonisamide  i. Other Beta Blockers: Atenolol, Metoprolol  j. Anti-hypertensives: Candesartan, Lisinopril, Amlodipine, Verapamil  k. Other TCAs: Amitriptyline  l. Cyproheptadine  Acute treatments:  1. As above, Rimegepant '75mg'$  daily as needed for headache spikes up to 16 uses per month.  2. Can continue over-the-counter non-steroidal anti-inflammatory medications (NSAIDs) such as Ibuprofen 600-'800mg'$ , Naproxen 220-'440mg'$ , or Tylenol 1000-'2000mg'$  for acute headache  a. You may even choose to combine this with your GEPANT for hopeful improved (synergistic) effect. We would call this "double acute therapy"  b. Cautious to LIMIT total monthly NSAID use to on no more than 12 days  per month, so as to avoid the development of "rebound" headaches (or medication-overuse headache/MOH)   3. START THE NERIVIO ARM BAND DEVICE FOR ACUTE MIGRAINE ATTACKS: Jeanella Cara is an FDA-approved device for the acute treatment of migraine. It is a novel, remote electrical neuromodulation device that is applied to the upper arm to stimulate the peripheral nerves using a smartphone-controlled electronic pulses to create a response which helps to mitigate the effects of a migraine in adult patients with or without aura. Utilizing [employer], we are now able to offer Nerivio at a more affordable price (previously 12 treatments/1 device for $99). Now, any patient whose prescription is sent to [facility] and who has either active medical or pharmacy benefit insurance will be dispensed their first Rx for no more than $10. Refills will be as little at $0 but in no case more than $99.  a. Each treatment can be used for 45 minutes.  b. We advise patients apply (1) 45 minute treatment as early as possible, within 60 minutes of migraine onset, and to limit use to once a day.  c. The treatment's intensity begins at 12% and can go up to 100%. The average intensity is between 30% and 35%. We recommend patients to go up to an intensity level that they feel is strong yet comfortable and not painful.  d. Side effects may include: warm sensation, numbness, tingling, redness, itching, arm pain, and muscle spasm. All of these were reported mild, did not require treatment, and resolved within 24 hours. The device should not be used by people with congestive  heart failure (CHF), severe cardiac or cerebrovascular disease, uncontrolled epilepsy, and people with an active implantable medical device such as pacemaker, hearing aid implant, or any implanted electronic device. Do not use the device on an arm with a metallic implant. In such cases, consider using it on the other upper arm.  Nerivio mechanism of action:  https://ball-rodriguez.info/  To learn more about Nerivio Migra, please go to: [email or web address]   To watch the overview and demo, please go to: [email or web address].org/  and  [email or web address]  4. Future considerations:  a. Anti-emetics  b. Other NSAIDs: Diclofenac, Toradol  c. Other Gepants: Ubrogepant  d. Other Triptans, namely longer-acting ones (Frova, Nara) to try to mitigate side effects  e. Cefaly Device (acute setting)  f. Hydroxyzine  g. At-home SPG blockade  Non-pharmacological approaches:  1. Headache lifestyle modifications always ongoing. The biggest areas of focus for you seem to include increased hydration; ongoing regular exercise, regular sleep habits, stress reduction/coping strategies  2. Future considerations:  a. Headache medicine procedures such as nerve blocks, trigger point injections (TPIs), or sphenopalatine ganglion (SPG) blocks    Reports to currently be in-between PCPs. He will establish with one ASAP for ongoing coordination of care and necessary prescription refills.  Follow up with myself in 10/01/2021 for Botox #11 (already scheduled)    The patient understood that, as a tertiary specialty clinic, this was a consultation visit only. That we ask that the patient work with his PCP to consider and ultimately decide whether to implement the following recommendations. The PCP is more familiar with the patient's other health conditions and psychosocial concerns. Having medications managed by a single provider also limits duplication and risk of interactions. This time I will prescribe: Nerivio Migra and will refill his Nurtec, given is in between PCPs    I spent a total of 37 minutes in face-to-face time with the patient, and in non-face-to-face activities, conducted today directly related to this video visit, including reviewing records and tests, obtaining history, performing examination, placing orders,  communicating with other healthcare professionals, counseling the patient, family or caregiver, documenting in the medical record, and/or care coordination for the diagnoses above.    Gwendel Hanson, MD  Assistant Professor of Neurology  [place]

## 2021-08-21 NOTE — Patient Instructions (Signed)
Thank you for allowing me to participate in your care. I hope that you found your visit today helpful and that I have addressed most of your questions and concerns. MyChart is the best method of communication with me and my office. If you have questions about your condition, your treatment plan or medications, please contact us either by MyChart or by phone to our front office staff at [phone]. Also a friendly reminder that our fax number if again needed is [phone].    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 We discussed considering adding Magnesium Oxide '400mg'$  daily, if he'd like, to see if further additive to prevention. Acutely, will offer the [facility] Device to utilize when needed for pain spikes (even milder ones, which he has historically not treated pharmacologically).    RECOMMENDATIONS:  Recommend diagnostic testing:  At this point, agree with Neurosurgery (Dr. [name]) plans for 1-year follow up surveillance MRI Brain w/wo Contrast (falling ~08/2021)  Prophylactic treatment:  Continue Botulinum toxin PREEMPT Protocol injections - already scheduled for Botox #11 10/01/2021  Continue Rimegepant (Nurtec ODT =an oral CGRP  blocking small molecule) '75mg'$  daily using every other day for the prevention of migraine.  Additionally may use acutely another time, technically up to 16 uses per month.  Consider ADDING Magnesium Oxide '400mg'$  nightly  Future considerations:  Memantine  Neutraceuticals  Cefaly Device (preventive setting)  CGRP mABs: Emgality, Aimovig, Ajovy, Vyepti  Gepants (preventively): Rimegepant, Atogepant  SNRIs: Venlafaxine, Duloxetine  Other GABAs: Pregabalin  AEDs: Lamotrigine, Topiramate, Depakote, Zonisamide  Other Beta Blockers: Atenolol, Metoprolol  Anti-hypertensives: Candesartan, Lisinopril, Amlodipine, Verapamil  Other TCAs: Amitriptyline  Cyproheptadine  Acute treatments:  As above, Rimegepant '75mg'$  daily as needed for headache spikes up to 16 uses per month.  Can continue over-the-counter non-steroidal anti-inflammatory medications (NSAIDs) such as Ibuprofen 600-'800mg'$ , Naproxen 220-'440mg'$ , or Tylenol 1000-'2000mg'$  for acute headache  You may even choose to combine this with your GEPANT for hopeful improved (synergistic) effect. We would call this "double acute therapy"  Cautious to LIMIT total monthly NSAID use to on no more than 12 days per month, so as to avoid the development of "rebound" headaches (or medication-overuse headache/MOH)   START THE NERIVIO ARM BAND DEVICE FOR ACUTE MIGRAINE ATTACKS: Jeanella Cara is an FDA-approved device for the acute treatment of migraine. It is a novel, remote electrical neuromodulation device that is applied to the upper arm to stimulate the peripheral nerves using a smartphone-controlled electronic pulses to create a response which helps to mitigate the effects of a migraine in adult patients with or without aura. Utilizing [employer], we are now able to offer Nerivio at a more affordable price (previously 12 treatments/1 device for $99). Now, any patient whose prescription is sent to [facility] and who has either active medical or  pharmacy benefit insurance will be  dispensed their first Rx for no more than $10. Refills will be as little at $0 but in no case more than $99.  Each treatment can be used for 45 minutes.  We advise patients apply (1) 45 minute treatment as early as possible, within 60 minutes of migraine onset, and to limit use to once a day.  The treatment's intensity begins at 12% and can go up to 100%. The average intensity is between 30% and 35%. We recommend patients to go up to an intensity level that they feel is strong yet comfortable and not painful.  Side effects may include: warm sensation, numbness, tingling, redness, itching, arm pain, and muscle spasm. All of these were reported mild, did not require treatment, and resolved within 24 hours. The device should not be used by people with congestive heart failure (CHF), severe cardiac or cerebrovascular disease, uncontrolled epilepsy, and people with an active implantable medical device such as pacemaker, hearing aid implant, or any implanted electronic device. Do not use the device on an arm with a metallic implant. In such cases, consider using it on the other upper arm.  Nerivio mechanism of action: https://ball-rodriguez.info/  To learn more about Nerivio Migra, please go to: ShorterSale.fr   To watch the overview and demo, please go to: [email or web address].org/  and  [email or web address].uy  Future considerations:  Anti-emetics  Other NSAIDs: Diclofenac, Toradol  Other Gepants: Ubrogepant  Other Triptans, namely longer-acting ones (Frova, Nara) to try to mitigate side effects  Cefaly Device (acute setting)  Hydroxyzine  At-home SPG blockade  Non-pharmacological approaches:  Headache lifestyle modifications always ongoing. The biggest areas of focus for you seem to include increased hydration; ongoing regular exercise, regular sleep habits, stress reduction/coping strategies  Future considerations:  Headache medicine  procedures such as nerve blocks, trigger point injections (TPIs), or sphenopalatine ganglion (SPG) blocks    Reports to currently be in-between PCPs. He will establish with one ASAP for ongoing coordination of care and necessary prescription refills.  Follow up with myself in 10/01/2021 for Botox #11 (already scheduled)    The patient understood that, as a tertiary specialty clinic, this was a consultation visit only. That we ask that the patient work with his PCP to consider and ultimately decide whether to implement the following recommendations. The PCP is more familiar with the patient's other health conditions and psychosocial concerns. Having medications managed by a single provider also limits duplication and risk of interactions. This time I will prescribe: Nerivio Migra and will refill his Nurtec, given is in between PCPs      WHEN TO [place]  - If you have a question about a treatment recommended during a visit.  - If you think you are experiencing side effects from a medication prescribed by the headache specialist.   - If you are unsure how to take a medication that was prescribed by the headache specialist.  - If you are providing information about test results or records requested.  - If you would like to provide an update about inpatient admission.  - If you would like to request a procedure recommended to you.    If you are interested in being a part of Headache Research at [facility], please let me know via MyChart message and I will put our research coordinator in touch with you.

## 2021-08-24 MED ORDER — NURTEC ODT 75 MG DISINTEGRATING TABLET
75 | ORAL_TABLET | ORAL | 2 refills | Status: AC
Start: 2021-08-24 — End: ?
  Filled 2021-08-25: qty 16, 30d supply, fill #0

## 2021-08-24 NOTE — Telephone Encounter (Signed)
Refill request appropriate. Patient in between PCPs. Will authorize.    Robet Leu

## 2021-09-22 MED FILL — RIMEGEPANT 75 MG DISINTEGRATING TABLET: 75 mg | ORAL | 30 days supply | Qty: 16 | Fill #1 | Status: CP

## 2021-10-01 ENCOUNTER — Ambulatory Visit: Admit: 2021-10-01 | Discharge: 2021-10-09 | Payer: PRIVATE HEALTH INSURANCE | Attending: Neurology | Primary: Physician

## 2021-10-01 DIAGNOSIS — G43709 Chronic migraine without aura, not intractable, without status migrainosus: Secondary | ICD-10-CM

## 2021-10-01 MED ORDER — ONABOTULINUMTOXINA 200 UNIT SOLUTION FOR INJECTION
200 | Freq: Once | INTRAMUSCULAR | Status: AC
Start: 2021-10-01 — End: 2021-10-01
  Administered 2021-10-01: 22:00:00 via INTRAMUSCULAR

## 2021-10-01 NOTE — Progress Notes (Signed)
BOTULINUM TOXIN INJECTION  PROCEDURE NOTE    Re: Ralph Allen  MRN: [id number]  Date: 10/01/2021    Last visit brief summary 08/21/2021: Doing better with consistent Rimegepant in terms of severity (headaches all manageable for the most part), although freq 12-18/30. We discussed considering adding Magnesium Oxide 400mg  daily, if he'd like, to see if further additive to prevention. Acutely, offered the [facility] Device to utilize PRN for pain spikes (even milder ones, which he has historically not treated pharmacologically).    BP (!) 133/91 (BP Location: Left upper arm, Patient Position: Sitting, Cuff Size: Adult)   Pulse 101   Temp 36.6 C (97.8 F) (Temporal)   Resp 18   Ht 175.3 cm (5\' 9" )   Wt 73.9 kg (163 lb)   BMI 24.07 kg/m      Diagnosis: chronic migraine  Procedure: OnabotulinumtoxinA Injections (PREEMPT protocol)    The risks, benefits, and anticipated outcomes of the procedure, the risks and benefits of the alternatives to the procedure, and the roles and tasks of the personnel to be involved, were discussed with the patient, and the patient consents to the procedure and agrees to proceed. Written informed consent signed and submitted for scanning into the electronic medical record.    UNIVERSAL PROTOCOL / SAFETY CHECKLIST   Procedure to be performed: OnabotulinumtoxinA injections, PREEMPT protocol   Time Out: The correct patient, procedure, and site were confirmed at 2:50PM.    PROCEDURE    Treatment #11  Diluent: normal saline  Indication: chronic migraine     Injection Sites    Muscle Site & Dose Left  Right   corrugators 10 units, 2 sites 5  5   procerus 5 units, 1 site  5    frontalis 20 units, 4 sites 10  10   temporalis 40 units, 8 sites 20  20   occipitalis 30 units, 6 sites 15  15   cervical paraspinals 20 units, 4 sites 10  10   trapezius 30 units, 6 sites 15  15   additional (follow-the-pain)        TOTAL  155  units                                                                                                                          Total Units used: 155  Total Units wasted: 45    Patient tolerated the procedure without complications or significant blood loss. Post procedure instructions were provided to the patient and all questions were answered.     Change in Treatment Plan? No    Lot: [id number]  Exp: 02/2024    I spent a total of 10 minutes in face-to-face time with the patient, and in non-face-to-face activities, conducted today directly related to this visit, including reviewing records and tests, obtaining history, performing examination, placing orders, communicating with other healthcare professionals, counseling the patient, family or caregiver, documenting in the medical record, and/or care coordination for the diagnoses above.  This is independent of the time spent on the procedure performed today.    Follow up with myself in 12 weeks for Botox #12. Ralph Allen is stable and happy with current treatment plan; he will let me know if there is any reason to schedule an interval VideoVisit or not.    Robet Leu  Assistant Professor of Neurology  [employer]

## 2021-10-20 LAB — COMPLETE BLOOD COUNT WITH DIFFERENTIAL
Abs Basophils: 0.03 10*9/L (ref 0.00–0.10)
Abs Eosinophils: 0.1 10*9/L (ref 0.00–0.40)
Abs Imm Granulocytes: 0.01 10*9/L (ref <0.10)
Abs Lymphocytes: 1.41 10*9/L (ref 1.00–3.40)
Abs Monocytes: 0.25 10*9/L (ref 0.20–0.80)
Abs Neutrophils: 2.69 10*9/L (ref 1.80–6.80)
Hematocrit: 42.8 % (ref 41.0–53.0)
Hemoglobin: 15.3 g/dL (ref 13.6–17.5)
MCH: 32.6 pg (ref 26.0–34.0)
MCHC: 35.7 g/dL (ref 31.0–36.0)
MCV: 91 fL (ref 80–100)
Platelet Count: 264 10*9/L (ref 140–450)
RBC Count: 4.69 10*12/L (ref 4.40–5.90)
WBC Count: 4.5 10*9/L (ref 3.4–10.0)

## 2021-10-20 LAB — DRAW AND HOLD RAINBOW TUBES - BLUE TOP (ED USE ONLY)

## 2021-10-20 LAB — BASIC METABOLIC PANEL (NA, K, CL, CO2, BUN, CR, GLU, CA)
Anion Gap: 9 (ref 4–14)
Calcium, total, Serum / Plasma: 9.6 mg/dL (ref 8.4–10.5)
Carbon Dioxide, Total: 24 mmol/L (ref 22–29)
Chloride, Serum / Plasma: 108 mmol/L (ref 101–110)
Creatinine: 0.76 mg/dL (ref 0.73–1.24)
Glucose, non-fasting: 109 mg/dL (ref 70–199)
Potassium, Serum / Plasma: 4.1 mmol/L (ref 3.5–5.0)
Sodium, Serum / Plasma: 141 mmol/L (ref 135–145)
Urea Nitrogen, serum/plasma: 17 mg/dL (ref 7–25)
eGFRcr: 130 mL/min/{1.73_m2} (ref >59)

## 2021-10-20 LAB — TROPONIN I: Troponin I: <0.02 ug/L (ref 0.00–0.04)

## 2021-10-20 LAB — DRAW AND HOLD RAINBOW TUBES - 4 ML GOLD TOP (ED USE ONLY)

## 2021-10-20 MED FILL — RIMEGEPANT 75 MG DISINTEGRATING TABLET: 75 mg | ORAL | 30 days supply | Qty: 16 | Fill #2 | Status: CP

## 2021-10-20 NOTE — ED Triage Notes (Signed)
Medical Screening Exam    Seen By: Charleen Kirks, PA  Date:  10/20/2021    This MSE note is a preliminary screening to initiate workup and test ordering.      Chief Complaint/History:  No chief complaint on file.,    Left sided CP radiating to L face, shoulder, down arm into hand for 1 week. Has occurred in the past in setting of stress. Also c/o high blood pressure.     Sx are intermittent, woke up with pain today around 0800 and symptoms ongoing.     No provoking or alleviating symptoms    Nonsmoker, no HTN, no DM2, no cholesterol issues    Exercises regularly has never had pain with exercising    Review of Systems:   Denies fever     Physical Exam:   General: Awake and alert  Lungs: normal wob    MSE differential diagnosis:    Multiple differential diagnosis considered including, however not limited to atypical cp, unlikely acs, doubt pe     My MSE plan includes:   Testing as ordered.  Supportive care as ordered.

## 2021-10-20 NOTE — ED Provider Notes (Signed)
ED Attendings          Sharyn Lull A Vermon Grays 10/20/21 12:38      History     Language Interpreter    Flowsheet Row Most Recent Value   Interpreter Needed? No          Chief Complaint   Patient presents with    Chest Pain     Intermittent CP x 1 week. States radiates down L arm and up to L side of face. Hx benign brain tumor. States higher BP over past week.        HPI   64M, c/o CP, intermittent x1wk. Exercises vigourously several times per week. Pain not exertional, doesn't interfere with exercise. Felt mainly in L trapezius with some radiation onto L anterior chest and down arm to L fingers.  Also concerned that BP has been high. Has purchased home cuff, SBP readings between 110 to 140.  No shortness of breath, non-pleuritic.  No cough, no fever      Allergies/Contraindications   Allergen Reactions    Tramadol Nausea And Vomiting                     Social History     Socioeconomic History    Marital status: Single   Tobacco Use    Smoking status: Former Smoker     Types: Cigarettes, Cigars    Smokeless tobacco: Never Used   Substance and Sexual Activity    Alcohol use: Yes    Drug use: Not Currently     Types: Marijuana       Review of Systems     Review of Systems   All other systems reviewed and are negative.      Physical Exam   Triage Vital Signs:  BP: (!) 143/93, Heart Rate: 89, Pulse - Palpated/Pleth: 89, Temp: 37.3 C (99.1 F), *Resp: 16, SpO2: 97 %    Physical Exam  Vitals and nursing note reviewed.   Constitutional:       General: He is not in acute distress.     Appearance: He is well-developed and normal weight. He is not ill-appearing, toxic-appearing or diaphoretic.   HENT:      Head: Normocephalic and atraumatic.   Eyes:      Extraocular Movements: Extraocular movements intact.   Neck:      Trachea: No tracheal deviation.   Cardiovascular:      Rate and Rhythm: Normal rate and regular rhythm.      Heart sounds: Normal heart sounds.   Pulmonary:      Effort: Pulmonary effort is normal. No tachypnea,  accessory muscle usage or respiratory distress.      Breath sounds: Normal breath sounds. No stridor.   Chest:      Chest wall: No mass, tenderness or edema.   Abdominal:      Palpations: Abdomen is soft. There is no mass.      Tenderness: There is no abdominal tenderness. There is no guarding.   Musculoskeletal:      Cervical back: Normal range of motion and neck supple.      Right lower leg: No edema.      Left lower leg: No edema.      Comments: Tight L trap, ttp with L trap squeeze   Skin:     General: Skin is warm and dry.      Capillary Refill: Capillary refill takes less than 2 seconds.   Neurological:  General: No focal deficit present.      Mental Status: He is alert.      Motor: No weakness.          Initial Assessment (problem list and differential diagnosis)     Non-exertional pain, L trapezius onto chest and into LUE most cw compressive radiculopathy.  Counseled in supportive care for same  Low prob ACS -- ekg and trop reassuring and clinical hx not cw same  Low prob pnx/pna  PERC neg and no shortness of breath or pleuritic pain            Interpretations:  Lab, Imaging, and ECG     Labs Reviewed   Complete Blood Count with Differential   Result Value Ref Range    WBC Count 4.5 3.4 - 10.0 x10E9/L    RBC Count 4.69 4.40 - 5.90 x10E12/L    Hemoglobin 15.3 13.6 - 17.5 g/dL    Hematocrit 42.8 41.0 - 53.0 %    MCV 91 80 - 100 fL    MCH 32.6 26.0 - 34.0 pg    MCHC 35.7 31.0 - 36.0 g/dL    Platelet Count 264 140 - 450 x10E9/L    Abs Neutrophils 2.69 1.80 - 6.80 x10E9/L    Abs Lymphocytes 1.41 1.00 - 3.40 x10E9/L    Abs Monocytes 0.25 0.20 - 0.80 x10E9/L    Abs Eosinophils 0.10 0.00 - 0.40 x10E9/L    Abs Basophils 0.03 0.00 - 0.10 x10E9/L    Abs Imm Granulocytes 0.01 <0.10 W97X4/I   Basic Metabolic Panel (NA, K, CL, CO2, BUN, CR, GLU, CA)   Result Value Ref Range    Urea Nitrogen, Serum / Plasma 17 7 - 25 mg/dL    Calcium, total, Serum / Plasma 9.6 8.4 - 10.5 mg/dL    Chloride, Serum / Plasma 108 101 - 110  mmol/L    Creatinine 0.76 0.73 - 1.24 mg/dL    eGFRcr 130 >59 mL/min/1.32m2    Potassium, Serum / Plasma 4.1 3.5 - 5.0 mmol/L    Glucose, non-fasting 109 70 - 199 mg/dL    Sodium, Serum / Plasma 141 135 - 145 mmol/L    Carbon Dioxide, Total 24 22 - 29 mmol/L    Anion Gap 9 4 - 14   Troponin I   Result Value Ref Range    Troponin I <0.02 0.00 - 0.04 ug/L   Draw and Hold Rainbow Tubes - [employer] (ED use only)   Result Value Ref Range    Rainbow Draw - Blue Top ED Rainbow sample held for 24 hours    Draw and Hold Rainbow Tubes - 4 mL [employer] (ED use only)   Result Value Ref Range    Rainbow Draw - 4 mL Gold Top ED Rainbow sample held for 24 hours        I have reviewed the patient's labs.  All labs are within normal limits.    I have reviewed the patient's Radiology report(s).  All results are within normal limits.       Reassessment and Final Disposition   Feeling reassured  Advised wrt checking BP at home and discussing need for anti-HTN with PMD if BP consistently elevated  Return precautions discussed      ED Disposition: Data Unavailable  Clinical Impression:                     Marcelyn Ditty, MD  10/21/21 2154

## 2021-10-20 NOTE — Discharge Instructions (Addendum)
Your EKG and chest xray and blood tests were all normal.  Your left arm pain is likely due to compression of nerves from your tight left trapezius muscle causing what is known as a "cervical radiculopathy." You may use a heating pad and due stretches to decrease the tightness in your left trapezius muscle.  Please continue to monitor your blood pressure at home. There is no need for medication at this time.

## 2021-10-21 LAB — ECG 12-LEAD
Atrial Rate: 98 {beats}/min
Calculated P Axis: 73 degrees
Calculated R Axis: 80 degrees
Calculated T Axis: 46 degrees
P-R Interval: 130 ms
QRS Duration: 86 ms
QT Interval: 326 ms
QTcb: 416 ms
Ventricular Rate: 98 {beats}/min

## 2021-11-17 DIAGNOSIS — C719 Malignant neoplasm of brain, unspecified: Secondary | ICD-10-CM

## 2021-11-17 MED ORDER — GADOTERATE MEGLUMINE 0.5 MMOL/ML (376.9 MG/ML) INTRAVENOUS SOLUTION
0.5 | Freq: Once | INTRAVENOUS | Status: DC
Start: 2021-11-17 — End: 2021-11-17
  Administered 2021-11-18: 02:00:00 via INTRAVENOUS

## 2021-11-18 ENCOUNTER — Inpatient Hospital Stay: Admit: 2021-11-18 | Payer: PRIVATE HEALTH INSURANCE | Primary: Physician

## 2021-12-31 ENCOUNTER — Ambulatory Visit: Admit: 2021-12-31 | Discharge: 2022-01-07 | Payer: PRIVATE HEALTH INSURANCE | Attending: Neurology | Primary: Physician

## 2021-12-31 DIAGNOSIS — G43709 Chronic migraine without aura, not intractable, without status migrainosus: Secondary | ICD-10-CM

## 2021-12-31 MED ORDER — ADVAIR DISKUS 250 MCG-50 MCG/DOSE POWDER FOR INHALATION: 250-50 mcg/dose | RESPIRATORY_TRACT | Status: AC

## 2021-12-31 MED ORDER — ONABOTULINUMTOXINA 200 UNIT SOLUTION FOR INJECTION
200 | Freq: Once | INTRAMUSCULAR | Status: AC
Start: 2021-12-31 — End: 2021-12-31
  Administered 2021-12-31: 22:00:00 155 [IU] via INTRAMUSCULAR

## 2021-12-31 NOTE — Progress Notes (Signed)
BOTULINUM TOXIN INJECTION  IN-PERSON AND PROCEDURE VISIT NOTE    Patient Name: Ralph Allen   Medical Record #: [id number]   Date: 12/31/2021   DOB: July 15, 1996    Last visit brief summary 10/01/2021: Botox #11    VITAL SIGNS:  BP (!) 132/86 (BP Location: Left upper arm, Patient Position: Sitting, Cuff Size: Adult)   Pulse 103   Temp 36.5 C (97.7 F) (Temporal)   Resp 18   Ht 175.3 cm (5' 9" )   Wt 74.8 kg (165 lb)   BMI 24.37 kg/m     INTERVAL HISTORY  Patient presents for Botox PREEMPT protocol treatment today. Things continue to be stable. No new complaints.     This patient's headache condition when diagnosed (start of Botox treatment) met criteria for chronic migraine (including >4 hours in duration, and present on 15 or more days per month). They have tried and failed prevention medications from at least 1-3 different classes.    Botox effective: Patient reports that since starting Botox, headache severity improved by more than 50%, frequency by more than 50%, and duration by more than 50%. Overall patient reports that there has been more than 100 hours of improvement in headache frequency.    For a detailed headache history and a summary of current and prior headache treatments, please review my most recent non-procedural note.    I reviewed the patient's PMH, FH, and SH as well as current medications and allergies.    Current Medications       Dosage    albuterol 90 mcg/actuation metered dose inhaler Inhale 2 puffs into the lungs daily as needed       cetirizine (ZYRTEC) 10 mg tablet Take 10 mg by mouth daily    rimegepant (NURTEC ODT) 75 mg rapid dissolve tablet Place 1 tablet (75 mg total) onto the tongue every other day . Limit 1 tablet (62m) in 24 hours    triamcinolone (NASACORT AQ) 55 mcg nasal spray 110 mcg by Nasal route daily    ADVAIR DISKUS 250-50 mcg/dose diskus inhaler 1 puff every 12 (twelve) hours      Clinic-Administered Medications       Dosage    onabotulinum toxin A (BOTOX)  injection 155 Units 155 Units, Intramuscular, Once, Keep Refrigerated Until Use        Allergies/Contraindications   Allergen Reactions    Tramadol Nausea And Vomiting      NEUROLOGIC EXAM  The patient is well appearing and in no acute distress.  Speech is fluent. The face appears symmetric at rest and with excursion. There is no significant atrophy in the frontalis, corrugator, and procerus muscles.  All extremities move symmetrically.  The patient was observed ambulating with normal casual gait and no ataxia.    IMPRESSION  Dx: Chronic migraine  We will proceed with onabotulinum toxin A (Botox) injections per the PREEMPT protocol* in order to optimize this patient's treatment plan and improve headache burden, function, and quality of life. The patient meets diagnostic criteria for chronic migraine given the presence of  >/= 15 headache days a month (of which at least 8 are migrainous) for a period of >/= 3 months. Headache episodes have a duration of > 4 hours and are severe in intensity. These episodes are accompanied by migrainous features including one or more of the following: nausea, vomiting, photophobia, phonophobia, osmophobia, movement sensitivity.  Headaches are disabling. The patient has failed prevention medications in at least 2-3 different classes and trials lasted  at least 2-3 months (with the exception of shorter trials due to intolerable side effects).  *The Botox PREEMPT protocol is FDA-approved for chronic migraine and uses 155 units of onabotulinumtoxinA diluted in normal saline, distributed in the following injection sites: bilateral corrugators (10 units, divided in 2 sites), procerus (5 units, in 1 site), frontalis (20 units, divided in 4 sites), bilateral temporalis (40 units, divided in 8 sites), bilateral occipitalis (30 units, divided in 6 sites), bilateral cervical paraspinal muscles (20 units, divided in 6 sites), and the bilateral trapezius (30 units, divided in 6 sites).    PLAN  No  planned changes to other headache treatments.    Additional recommendations discussed at today's visit include: N/A    Follow up in 3 months for next Botox treatment.    PROCEDURE  OnabotulinumtoxinA Injections (PREEMPT protocol)    Consent Statement:  Date of consent: 12/31/2021   Time of consent: 1:25PM  The risks, benefits, and anticipated outcomes of the procedure, the risks and benefits of the alternatives to the procedure, and the roles and tasks of the personnel to be involved, were discussed with the patient, and the patient consents to the procedure and agrees to proceed. Ample time was provided to the patient to understand the procedure and provide conset. Questions were asked and answered to patient satisfaction. Written informed consent signed and submitted for scanning into the electronic medical record. If a language barrier was present, a medical interpreter was used.  The patient is not taking medications with anticoagulant properties.    UNIVERSAL PROTOCOL / SAFETY CHECKLIST   Procedure to be performed: OnabotulinumtoxinA injections, PREEMPT protocol   Time Out: The correct patient, procedure, and site were confirmed at 1:25PM.    Treatment #12  Diluent: normal saline  Indication: chronic migraine     Injection Sites    Muscle Site & Dose Left  Right   corrugators 10 units, 2 sites 5  5   procerus 5 units, 1 site  5    frontalis 20 units, 4 sites 10  10   temporalis 40 units, 8 sites 20  20   occipitalis 30 units, 6 sites 15  15   cervical paraspinals 20 units, 4 sites 10  10   trapezius 30 units, 6 sites 15  15   additional (follow-the-pain)        TOTAL  155  units      Total units used: 155  Total units wasted: 45    Patient tolerated the procedure without complications or significant blood loss. Post procedure instructions were provided to the patient and all questions were answered.     Change in Treatment Plan? No    Lot: [id number]  Exp: 02/2024    I spent a total of 10 minutes in face-to-face  time with the patient, and in non-face-to-face activities, conducted today directly related to this visit, including reviewing records and tests, obtaining history, performing examination, placing orders, communicating with other healthcare professionals, counseling the patient, family or caregiver, documenting in the medical record, and/or care coordination for the diagnoses above. This is independent of the time spent on the procedure performed today.    ------  Follow up with myselfin 12 weeks for Botox #13. Ralph Allen is stable and happy with current treatment plan; he will let me know if there is any reason to schedule an interval VideoVisit or not.    Ralph Leu, MD  Assistant Professor of Neurology  [employer]

## 2022-03-11 ENCOUNTER — Ambulatory Visit
Admit: 2022-03-11 | Discharge: 2022-03-18 | Payer: PRIVATE HEALTH INSURANCE | Attending: Nurse Practitioner | Primary: Physician

## 2022-03-11 DIAGNOSIS — R350 Frequency of micturition: Secondary | ICD-10-CM

## 2022-03-11 DIAGNOSIS — N9489 Other specified conditions associated with female genital organs and menstrual cycle: Secondary | ICD-10-CM

## 2022-03-11 MED ORDER — OXYBUTYNIN CHLORIDE ER 5 MG TABLET,EXTENDED RELEASE 24 HR
5 mg | ORAL_TABLET | Freq: Every day | ORAL | 2 refills | Status: DC
Start: 2022-03-11 — End: 2022-03-15

## 2022-03-11 NOTE — Patient Instructions (Signed)
In summary, we discussed the management of urinary frequency not associated with UTI.  You agreed with the plan listed below, at this time..    Plan  Fluid management and awareness of bladder irritants.  Trial Oxybutynin  Bladder training  Uroflow study in 6 weeks.        Thank you for allowing [place] to be part of your health care.    Take care    Clarene Essex, NP-C    BLADDER DIARY FORM TO PRINT    [email or web address]://[email or web address]/guidelines/sufu-[email or web address]-faq.[email or web address]         Bladder Irritants  Certain foods and drinks have been associated with worsening symptoms of urinary frequency, urgency, urge incontinence, or  bladder pain. If you suffer from any of these conditions, you may wish to try eliminating one or more of these foods from your  diet and see if your symptoms improve. If bladder symptoms are related to dietary factors, strict adherence to a diet that  eliminates the food should bring marked relief in 10 days. Once you are feeling better, you can begin to add foods back into  your diet, one at a time. If symptoms return, you will be able to identify the irritant. As you add foods back to your diet it is  very important that you drink significant amounts of water.     List of Common Bladder Irritants*    Alcoholic beverages  Apples and apple juice  Cantaloupe  Carbonated beverages  Chili and spicy foods  Chocolate  Citrus fruit  Coffee (including decaffeinated)  Cranberries and cranberry juice  Grapes  Guava  Milk Products: milk, cheese, cottage cheese, yogurt, ice cream  Peaches  Pineapple  Plums  Strawberries  Sugar especially artificial sweeteners, saccharin, aspartame, corn sweeteners, honey, fructose, sucrose, lactose  Tea  Tomatoes and tomato juice  Vitamin B complex  Vinegar  *Most people are not sensitive to ALL of these products; your goal is to find the foods that make YOUR  symptoms worse     Low-acid fruit substitutions include apricots, papaya, pears and watermelon. Coffee drinkers can drink Kava or  other lowacid instant drinks. Tea drinkers can substitute non-citrus herbal and sun brewed teas. Calcium carbonate co-buffered with  calcium ascorbate can be substituted for Vitamin C. Prelief is a dietary supplement that works as an acid blocker for the  bladder.  .    Training your bladder     Treatment choices for urinary incontinence range from lifestyle changes to surgery. Your treatment will depend on the underlying problems causing the incontinence. But keep in mind that no treatment works perfectly, and you may have to try more than one approach before you find the one that best suits your needs. Treatments may be different for men and women. Because there are a variety of options, your preferences are important in developing a plan.  For example, a woman may be a candidate for either injections of bulking agents or a sling procedure. If she is in her 61s and likes to do kickboxing for exercise, she may not be dry enough with the injections and may choose sling surgery. A woman with similar exam and test results but a less active lifestyle might get along fine with injections.  It's also important to know that less invasive treatments, such as biofeedback or pelvic floor exercises, are a good first step and can be helpful, but may not be as effective as some surgical procedures. You and your physician need to decide which is  most appropriate for you. Check with your health plan to find out which therapies are covered. Treatment for urinary incontinence is an area of active research, and new approaches are under development.  Bladder training  You might be teaching your bladder some bad habits--habits that can gradually result in incontinence or frequent bathroom breaks. For example, if you routinely urinate before your bladder is full, it learns to signal the need to go when less volume is present. That can set up a vicious cycle, as you respond to the new urges and teach your bladder to cry "run" when less  and less urine is present.  Luckily, old bladders can learn new tricks. Bladder training, a program of urinating on schedule, enables you to gradually increase the amount of urine you can comfortably hold. Bladder training is a mainstay of treatment for urinary frequency and overactive bladder in both women and men, alone or in conjunction with medications or other techniques. It can also help prevent or lessen symptoms of overactive bladder that may emerge after surgery for stress incontinence. You can try it on your own or with the guidance and support of a health professional. Because bladder training is low-cost and low-risk, your clinician may encourage you to try it first, even before specific diagnostic tests are performed.  Step-by-step bladder-training technique  Keep track. For a day or two, keep track of the times you urinate or leak urine during the day.   Calculate. On average, how many hours do you wait between urinations during the day?   Choose an interval. Based on your typical interval between urinations, select a starting interval for training that is 15 minutes longer. If your typical interval is one hour, make your starting interval one hour and 15 minutes.   Hold back. When you start training, empty your bladder first thing in the morning and not again until the interval you've set. If the time arrives before you feel the urge, go anyway. If the urge hits first, remind yourself that your bladder isn't really full, and use whatever techniques you can to delay going. Try the pelvic floor exercises sometimes called Kegels, or simply try to wait another five minutes before walking slowly to the bathroom.   Increase your interval. Once you are comfortable with your set interval, increase it by 15 minutes. Over several weeks or months, you may find you are able to wait much longer and that you experience far fewer feelings of urgency or episodes of urge incontinence.  Keeping a bladder  diary  Complete the information for two consecutive 24-hour periods. Record both day and night.   Begin with first urination upon arising.   Record intake amount in ounces and type of fluid (for example, coffee, juice, water, etc.).   Record approximate urine output and time of urination.

## 2022-03-11 NOTE — H&P (Signed)
. The available consultant for this service is Katrine Coho, MD.             Urology Follow-up Patient Note    Subjective    Ralph Allen is a(n) 26 y.o. male who presents with the following:    Chief Complaint   Patient presents with    FVR/PVR     Self       HISTORY OF PRESENT ILLNESS:    It was a pleasure seeing Ralph Allen back for follow up in the [facility] today. As you know, Ralph Allen is a 26 y.o. male who we have been following for difficulty in urination, which has been ongoing since years..     Since our last visit, Ralph Allen has been scheduled for uroflow study but he just voided 40 minutes ago.  He is still having urinary frequency during the day.  He denied nocturia 0, no dysuria and no hematuria.  He drinks coffee during the day but trying to limit water intake.   Lannis denies any fever.    PREVIOUS VISIT  01/16/2022  HISTORY OF PRESENT ILLNESS:    Quindon Denker was seen in the Urology faculty practice clinic at [facility] as a new patient in a consultation at the request of Dr. Thelma Comp, Holli Humbles, MD for evaluation and possible treatment of difficulty in urination , which has been ongoing since 2 years.     At this initial visit, Ralph Allen reports that for the past 2 years,he has been having difficulty starting urination associated with occasinal dribbling, urges, frequency and feelings of incomplete bladder emptying,  Nocturia 0.  Seen by urologist from [place] but now moved to [facility]  He was evaluated by uroflow showed showed emptied bladder  Still having the need to void  Unable to control  And needs to be relaxed.to urinate.  Referral to PT.was made while in previous state  He still has occasional frequent urination but, nocturia 0.  Has hesitancy, sometimes intermittency,  No dysuria,  Tried Alfuzosin 10 mg x month but did not help.  Has also constipation    The patient's past medical, surgical, medication, allergy, family, and social histories were  reviewed and noncontributory to this illness/condition except as noted below:    The patient is allergic to:   Allergies/Contraindications   Allergen Reactions    Tramadol Nausea And Vomiting       The patient is currently taking the following medications:  No outpatient medications have been marked as taking for the 03/11/22 encounter (Office Visit) with Freeman Caldron, NP.        The patient's past medical history is notable for:  No past medical history on file.    The patient's past surgical history is notable for:  Surgical History          No past surgical history on file.          The patient's past family  history is notable for:               The patient's past social  history is notable for:  Social History     Tobacco Use    Smoking status: Former     Types: Cigarettes, Cigars    Smokeless tobacco: Never   Substance and Sexual Activity    Alcohol use: Yes    Drug use: Not Currently     Types: Marijuana         Review  of systems was performed and reviewed today. It is notable for:     Constitutional: Negative for fever, chills, weight loss and malaise/fatigue.   Respiratory: Negative for cough, sputum production and wheezing.    Cardiovascular: Negative for chest pain and palpitations.   Gastrointestinal: Negative for heartburn, nausea, vomiting, abdominal pain, diarrhea and constipation.   Genitourinary: Negative for dysuria, urgency and frequency.   Psychiatric/Behavioral: Negative for depression. The patient is not nervous/anxious.      Objective      PHYSICAL EXAM:     During the physical examination today, the patient's vital signs were  BP 117/79   Pulse 87   Temp 36.4 C (97.6 F) (Skin)   Ht 175.3 cm ('5\' 9"'$ )   Wt 72.6 kg (160 lb)   SpO2 99%   BMI 23.63 kg/m    Body mass index is 23.63 kg/m.    General: Alert and oriented times three, pleasant, in no acute distress  Cardiovascular: No pitting edema, radial pulses symmetric  Chest: Breathing is unlabored  Abdomen: Soft, non-tender,  non-distended, no palpable masses  Genitourinary: No costovertebral angle tenderness, genital exam -  Deferred  Psychiatric: Mood and affect are normal, the patient is socially appropriate      REVIEW OF TESTING:    The following lab results were personally reviewed by me:       Lab Review  Lab Results   Component Value Date    WBC Count 4.5 10/20/2021    Hemoglobin 15.3 10/20/2021    Hematocrit 42.8 10/20/2021    MCV 91 10/20/2021    Platelet Count 264 10/20/2021      Lab Results   Component Value Date    Calcium, total, Serum / Plasma 9.6 10/20/2021      Lab Results   Component Value Date    Sodium, Serum / Plasma 141 10/20/2021    Potassium, Serum / Plasma 4.1 10/20/2021    Chloride, Serum / Plasma 108 10/20/2021    Carbon Dioxide, Total 24 10/20/2021      Lab Results   Component Value Date    Creatinine 0.76 10/20/2021      UROFLOW  Studies: Simple flow study with Voided Volume 29 cc, Vmax 4.2 ml/sec,   Ave Flow rate: 2.1 ml/sec  Duration: 16.6    PVR 0 cc.  Curve: Abnormal curve    The urine volume is inadequate for valid interpretation.    Assessment and Plan      Problem Based Assessment and Plan      High-tone pelvic floor dysfunction  Voiding dysfunction  Urinary frequency: Trial of Oxybutynin    Fluid management and awareness of bladder irritants.  Trial Oxybutynin  Bladder training  Uroflow study in 6 weeks.       Assessment:    It was a pleasure seeing Ralph Allen back in clinic today.     I addressed urinary frequency with the patient today, which is a follow up of a pre-existing problem for [place]. This issue is getting worse.      Ralph Allen still complaint of urinary frequency during the day.  I explained that I cannot find any urological pathology that could explain his urinary frequency which is happening only during the day.  Since he is bothered I suggested a trial of Oxybutynin. I emphasized the avoidance of bladder irritants and anxiety to minimize overactive bladder symptoms.  PTNS as an  option can be discussed in the future.   I discussed bladder  irritants and bladder training  Training your bladder     Treatment choices for urinary incontinence range from lifestyle changes to surgery. Your treatment will depend on the underlying problems causing the incontinence. But keep in mind that no treatment works perfectly, and you may have to try more than one approach before you find the one that best suits your needs. Treatments may be different for men and women. Because there are a variety of options, your preferences are important in developing a plan.  For example, a woman may be a candidate for either injections of bulking agents or a sling procedure. If she is in her 21s and likes to do kickboxing for exercise, she may not be dry enough with the injections and may choose sling surgery. A woman with similar exam and test results but a less active lifestyle might get along fine with injections.  It's also important to know that less invasive treatments, such as biofeedback or pelvic floor exercises, are a good first step and can be helpful, but may not be as effective as some surgical procedures. You and your physician need to decide which is most appropriate for you. Check with your health plan to find out which therapies are covered. Treatment for urinary incontinence is an area of active research, and new approaches are under development.  Bladder training  You might be teaching your bladder some bad habits--habits that can gradually result in incontinence or frequent bathroom breaks. For example, if you routinely urinate before your bladder is full, it learns to signal the need to go when less volume is present. That can set up a vicious cycle, as you respond to the new urges and teach your bladder to cry "run" when less and less urine is present.  Luckily, old bladders can learn new tricks. Bladder training, a program of urinating on schedule, enables you to gradually increase the amount of  urine you can comfortably hold. Bladder training is a mainstay of treatment for urinary frequency and overactive bladder in both women and men, alone or in conjunction with medications or other techniques. It can also help prevent or lessen symptoms of overactive bladder that may emerge after surgery for stress incontinence. You can try it on your own or with the guidance and support of a health professional. Because bladder training is low-cost and low-risk, your clinician may encourage you to try it first, even before specific diagnostic tests are performed.  Step-by-step bladder-training technique   Keep track. For a day or two, keep track of the times you urinate or leak urine during the day.    Calculate. On average, how many hours do you wait between urinations during the day?    Choose an interval. Based on your typical interval between urinations, select a starting interval for training that is 15 minutes longer. If your typical interval is one hour, make your starting interval one hour and 15 minutes.    Hold back. When you start training, empty your bladder first thing in the morning and not again until the interval you've set. If the time arrives before you feel the urge, go anyway. If the urge hits first, remind yourself that your bladder isn't really full, and use whatever techniques you can to delay going. Try the pelvic floor exercises sometimes called Kegels, or simply try to wait another five minutes before walking slowly to the bathroom.    Increase your interval. Once you are comfortable with your set interval, increase it by 15  minutes. Over several weeks or months, you may find you are able to wait much longer and that you experience far fewer feelings of urgency or episodes of urge incontinence.  Keeping a bladder diary   Complete the information for two consecutive 24-hour periods. Record both day and night.    Begin with first urination upon arising.    Record intake amount in ounces  and type of fluid (for example, coffee, juice, water, etc.).    Record approximate urine output and time of urination.       Plan:    The natural history as well as any appropriate treatment options were discussed with the patient today. Based on todays visit, we will plan for Oxybutynin. Additional workup ordered today included none.     FOLLOW UP: Uroflow PVR    The patient was instructed to call our clinic nursing line at [phone] or the main line at [phone] with any questions or go to nearest emergency room should there be any fevers, chills, nausea, vomiting, worsening pain, chest pain, or inability to void.    Thank you for allowing [place] to participate in the care of this patient. Please do not hesitate to contact us should you have any questions or concerns.        I personally reviewed and interpreted a test as summarized in the note. uroflow study  I, Freeman Caldron, NP, spent a total of 30 minutes on this patient's care on the day of their visit excluding time spent related to any billed procedures. This time includes time spent with the patient as well as time spent documenting in the medical record, reviewing patient's records and tests, obtaining history, placing orders, communicating with other healthcare professionals, counseling the patient, family, or caregiver, and/or care coordination for the diagnoses above.      Services provided by Freeman Caldron, NP on 03/11/2022 9:22 AM

## 2022-03-15 MED ORDER — OXYBUTYNIN CHLORIDE ER 5 MG TABLET,EXTENDED RELEASE 24 HR: 5 mg | ORAL | 2 refills | Status: DC

## 2022-03-15 NOTE — Progress Notes (Signed)
Rerouted to contracted/preferred specialty pharmacy - [facility].    Reroute per patient request.

## 2022-04-05 NOTE — Telephone Encounter (Signed)
Patient called to schedule for May 3rd appointment that was originally offered to him via my chart message for Botox on 4/6. He was originally scheduled for the 67 th of this month. Patient expressed frustration for not receiving a call regarding the cancellation and rescheduling date. He was also concerned that we had to schedule 2 weeks out for authorization when his appointment was supposed to be April 27 th. He asked if he could get in contact with authorization team, but I told him I unfortunately could not do so. I told him I could send message to management to further escalate authorization/ see if we could schedule sooner. He said that he was sure that if we called insurance we could get authorization within 48 hours. Since he has been getting Botox for while now. I have sent message to management to follow up with him. Patient said he would like a call as soon as possible. Patient expressed gratitude for escalation.

## 2022-04-13 MED ORDER — OXYBUTYNIN CHLORIDE ER 5 MG TABLET,EXTENDED RELEASE 24 HR
5 | ORAL_TABLET | Freq: Every day | ORAL | 2 refills | 20.00000 days | Status: DC
Start: 2022-04-13 — End: 2022-04-14

## 2022-04-14 ENCOUNTER — Ambulatory Visit: Admit: 2022-04-14 | Discharge: 2022-04-19 | Payer: PRIVATE HEALTH INSURANCE | Attending: Neurology | Primary: Physician

## 2022-04-14 DIAGNOSIS — G43709 Chronic migraine without aura, not intractable, without status migrainosus: Secondary | ICD-10-CM

## 2022-04-14 MED ORDER — ONABOTULINUMTOXINA 200 UNIT SOLUTION FOR INJECTION
200 | Freq: Once | INTRAMUSCULAR | Status: AC
Start: 2022-04-14 — End: 2022-04-14
  Administered 2022-04-14: 21:00:00 via INTRAMUSCULAR

## 2022-04-14 NOTE — Progress Notes (Signed)
BOTULINUM TOXIN INJECTION  IN-PERSON AND PROCEDURE VISIT NOTE    Patient Name: Ralph Allen   Medical Record #: [id number]   Date: 04/14/2022   DOB: 06/19/1996    Last visit brief summary 12/31/2021: Botox #12. Things continue to be stable. No new complaints.    VITAL SIGNS:  BP 137/77 (BP Location: Left upper arm, Patient Position: Sitting, Cuff Size: Adult)   Pulse 83   Temp 36.4 C (97.5 F) (Temporal)   Resp 20   Ht 175.3 cm (5\' 9" )   Wt 72.6 kg (160 lb)   BMI 23.63 kg/m       INTERVAL HISTORY  Patient presents for Botox PREEMPT protocol treatment today. The last 2 weeks have been especially rough, as we are 2 weeks delayed for Botox today, due to various scheduling conflicts. Rimegepant not working as well in this setting of relative worsening.    This patient's headache condition when diagnosed (start of Botox treatment) met criteria for chronic migraine (including >4 hours in duration, and present on 15 or more days per month). They have tried and failed prevention medications from at least 1-3 different classes.    Botox effective: Patient reports that since starting Botox, headache severity improved by more than 50%, frequency by more than 50%, and duration by more than 50%. Overall patient reports that there has been more than 100 hours of improvement in headache frequency.    For a detailed headache history and a summary of current and prior headache treatments, please review my most recent non-procedural note.    I reviewed the patient's PMH, FH, and SH as well as current medications and allergies.    Current Medications       Dosage    ADVAIR DISKUS 250-50 mcg/dose diskus inhaler 1 puff every 12 (twelve) hours    albuterol 90 mcg/actuation metered dose inhaler Inhale 2 puffs into the lungs daily as needed       cetirizine (ZYRTEC) 10 mg tablet Take 10 mg by mouth daily    oxybutynin (DITROPAN XL) 5 mg 24 hr tablet TAKE 1 TABLET (5 MG TOTAL) BY MOUTH DAILY.    rimegepant (NURTEC ODT) 75  mg rapid dissolve tablet Place 1 tablet (75 mg total) onto the tongue every other day . Limit 1 tablet (75mg ) in 24 hours    triamcinolone (NASACORT AQ) 55 mcg nasal spray 110 mcg by Nasal route daily      Clinic-Administered Medications       Dosage    onabotulinum toxin A (BOTOX) injection 155 Units (Completed) 155 Units, Intramuscular, Once, Keep Refrigerated Until Use        Allergies/Contraindications   Allergen Reactions    Tramadol Nausea And Vomiting      NEUROLOGIC EXAM  The patient is well appearing and in no acute distress.  Speech is fluent. The face appears symmetric at rest and with excursion. There is no significant atrophy in the frontalis, corrugator, and procerus muscles.  All extremities move symmetrically.  The patient was observed ambulating with normal casual gait and no ataxia.    IMPRESSION  Dx: Chronic migraine  We will proceed with onabotulinum toxin A (Botox) injections per the PREEMPT protocol* in order to optimize this patient's treatment plan and improve headache burden, function, and quality of life. The patient meets diagnostic criteria for chronic migraine given the presence of  >/= 15 headache days a month (of which at least 8 are migrainous) for a period of >/= 3 months.  Headache episodes have a duration of > 4 hours and are severe in intensity. These episodes are accompanied by migrainous features including one or more of the following: nausea, vomiting, photophobia, phonophobia, osmophobia, movement sensitivity.  Headaches are disabling. The patient has failed prevention medications in at least 2-3 different classes and trials lasted at least 2-3 months (with the exception of shorter trials due to intolerable side effects).  *The Botox PREEMPT protocol is FDA-approved for chronic migraine and uses 155 units of onabotulinumtoxinA diluted in normal saline, distributed in the following injection sites: bilateral corrugators (10 units, divided in 2 sites), procerus (5 units, in 1  site), frontalis (20 units, divided in 4 sites), bilateral temporalis (40 units, divided in 8 sites), bilateral occipitalis (30 units, divided in 6 sites), bilateral cervical paraspinal muscles (20 units, divided in 6 sites), and the bilateral trapezius (30 units, divided in 6 sites).    PLAN  No planned changes to other headache treatments.    Additional recommendations discussed at today's visit include: N/A    Follow up in 3 months for next Botox treatment.    PROCEDURE  OnabotulinumtoxinA Injections (PREEMPT protocol)    Consent Statement:  Date of consent: 04/14/2022   Time of consent: 2:45PM  The risks, benefits, and anticipated outcomes of the procedure, the risks and benefits of the alternatives to the procedure, and the roles and tasks of the personnel to be involved, were discussed with the patient, and the patient consents to the procedure and agrees to proceed. Ample time was provided to the patient to understand the procedure and provide conset. Questions were asked and answered to patient satisfaction. Written informed consent signed and submitted for scanning into the electronic medical record. If a language barrier was present, a medical interpreter was used.  The patient is not taking medications with anticoagulant properties.     UNIVERSAL PROTOCOL / SAFETY CHECKLIST   Procedure to be performed: OnabotulinumtoxinA injections, PREEMPT protocol   Time Out: The correct patient, procedure, and site were confirmed at 2:45PM.    Treatment #13  Diluent: normal saline  Indication: chronic migraine     Injection Sites    Muscle Site & Dose Left  Right   corrugators 10 units, 2 sites 5  5   procerus 5 units, 1 site  5    frontalis 20 units, 4 sites 10  10   temporalis 40 units, 8 sites 20  20   occipitalis 30 units, 6 sites 15  15   cervical paraspinals 20 units, 4 sites 10  10   trapezius 30 units, 6 sites 15  15   additional (follow-the-pain)        TOTAL  155  units      Total units used: 155  Total units  wasted: 45    Patient tolerated the procedure without complications or significant blood loss. Post procedure instructions were provided to the patient and all questions were answered.     Change in Treatment Plan? No    Lot: [id number]  Exp: 08/2024    I spent a total of 10 minutes in face-to-face time with the patient, and in non-face-to-face activities, conducted today directly related to this visit, including reviewing records and tests, obtaining history, performing examination, placing orders, communicating with other healthcare professionals, counseling the patient, family or caregiver, documenting in the medical record, and/or care coordination for the diagnoses above. This is independent of the time spent on the procedure performed today.    ------  Follow up with myselfin12 weeks for Botox #14. [name] is happy with current treatment plan; he will let me know if there is any reason to schedule an interval VideoVisit or not.    Dava Najjar, MD  Assistant Professor of Neurology  [facility]

## 2022-07-15 ENCOUNTER — Ambulatory Visit: Admit: 2022-07-15 | Discharge: 2022-07-15 | Payer: PRIVATE HEALTH INSURANCE | Attending: Neurology | Primary: Person

## 2022-07-15 DIAGNOSIS — G43709 Chronic migraine without aura, not intractable, without status migrainosus: Secondary | ICD-10-CM

## 2022-07-15 MED ORDER — ONABOTULINUMTOXINA 200 UNIT SOLUTION FOR INJECTION
200 | Freq: Once | INTRAMUSCULAR | Status: AC
Start: 2022-07-15 — End: 2022-07-15
  Administered 2022-07-15: 21:00:00 via INTRAMUSCULAR

## 2022-07-15 MED ORDER — LISINOPRIL 10 MG TABLET
10 mg | Freq: Every day | ORAL | Status: AC
Start: 2022-07-15 — End: ?

## 2022-07-15 NOTE — Progress Notes (Signed)
BOTULINUM TOXIN INJECTION  IN-PERSON AND PROCEDURE VISIT NOTE    Patient Name: Ralph Allen   Medical Record #: [id number]   Date: 07/15/2022  DOB: 11-Oct-1996    Last visit brief summary 04/14/2022: Botox #13. The last 2 weeks had been esp rough, as we were 2 weeks delayed for Botox this day, due to various scheduling conflicts. Rimegepant not working as well in this setting of relative worsening.    VITAL SIGNS:  BP 111/76 (BP Location: Left upper arm, Patient Position: Sitting, Cuff Size: Adult)   Pulse 75   Temp 36.6 C (97.9 F) (Temporal)   Ht 175.3 cm (5' 9" )   Wt 72.8 kg (160 lb 9.6 oz)   SpO2 96%   BMI 23.72 kg/m     INTERVAL HISTORY  Patient presents for B2otox PREEMPT protocol treatment today. He again notices significant treatment lapse leading up to this injection. Wearing off to near-daily headache again.    This patient's headache condition when diagnosed (start of Botox treatment) met criteria for chronic migraine (including >4 hours in duration, and present on 15 or more days per month). They have tried and failed prevention medications from at least 1-3 different classes.    Botox effective: Patient reports that since starting Botox, headache severity improved by more than 50%, frequency by more than 50%, and duration by more than 50%. Overall patient reports that there has been more than 100 hours of improvement in headache frequency.    For a detailed headache history and a summary of current and prior headache treatments, please review my most recent non-procedural note.    I reviewed the patient's PMH, FH, and SH as well as current medications and allergies.    Current Medications         Dosage    ADVAIR DISKUS 250-50 mcg/dose diskus inhaler 1 puff every 12 (twelve) hours    albuterol 90 mcg/actuation metered dose inhaler Inhale 2 puffs into the lungs daily as needed       lisinopriL (PRINIVIL,ZESTRIL) 10 mg tablet Take 10 mg by mouth daily    rimegepant (NURTEC ODT) 75 mg rapid  dissolve tablet Place 1 tablet (75 mg total) onto the tongue every other day . Limit 1 tablet (67m) in 24 hours    cetirizine (ZYRTEC) 10 mg tablet Take 10 mg by mouth daily    triamcinolone (NASACORT AQ) 55 mcg nasal spray 110 mcg by Nasal route daily          Allergies/Contraindications   Allergen Reactions    Tramadol Nausea And Vomiting      NEUROLOGIC EXAM  The patient is well appearing and in no acute distress.  Speech is fluent. The face appears symmetric at rest and with excursion. There is no significant atrophy in the frontalis, corrugator, and procerus muscles.  All extremities move symmetrically.  The patient was observed ambulating with normal casual gait and no ataxia.    IMPRESSION  Dx: Chronic migraine  We will proceed with onabotulinum toxin A (Botox) injections per the PREEMPT protocol* in order to optimize this patient's treatment plan and improve headache burden, function, and quality of life. The patient meets diagnostic criteria for chronic migraine given the presence of  >/= 15 headache days a month (of which at least 8 are migrainous) for a period of >/= 3 months. Headache episodes have a duration of > 4 hours and are severe in intensity. These episodes are accompanied by migrainous features including one or more of  the following: nausea, vomiting, photophobia, phonophobia, osmophobia, movement sensitivity.  Headaches are disabling. The patient has failed prevention medications in at least 2-3 different classes and trials lasted at least 2-3 months (with the exception of shorter trials due to intolerable side effects).  *The Botox PREEMPT protocol is FDA-approved for chronic migraine and uses 155 units of onabotulinumtoxinA diluted in normal saline, distributed in the following injection sites: bilateral corrugators (10 units, divided in 2 sites), procerus (5 units, in 1 site), frontalis (20 units, divided in 4 sites), bilateral temporalis (40 units, divided in 8 sites), bilateral  occipitalis (30 units, divided in 6 sites), bilateral cervical paraspinal muscles (20 units, divided in 6 sites), and the bilateral trapezius (30 units, divided in 6 sites).    PLAN  No planned changes to other headache treatments.    Additional recommendations discussed at today's visit include: N/A    Follow up in 3 months for next Botox treatment.    PROCEDURE  OnabotulinumtoxinA Injections (PREEMPT protocol)    Consent Statement:  Date of consent: 07/15/2022   Time of consent: 1:40PM  The risks, benefits, and anticipated outcomes of the procedure, the risks and benefits of the alternatives to the procedure, and the roles and tasks of the personnel to be involved, were discussed with the patient, and the patient consents to the procedure and agrees to proceed. Ample time was provided to the patient to understand the procedure and provide conset. Questions were asked and answered to patient satisfaction. Written informed consent signed and submitted for scanning into the electronic medical record. If a language barrier was present, a medical interpreter was used.  The patient is not taking medications with anticoagulant properties.    UNIVERSAL PROTOCOL / SAFETY CHECKLIST   Procedure to be performed: OnabotulinumtoxinA injections, PREEMPT protocol   Time Out: The correct patient, procedure, and site were confirmed at 1:40PM.    Treatment #14  Diluent: normal saline  Indication: chronic migraine     Injection Sites    Muscle Site & Dose Left  Right   corrugators 10 units, 2 sites 5  5   procerus 5 units, 1 site  5    frontalis 20 units, 4 sites 10  10   temporalis 40 units, 8 sites 20  20   occipitalis 30 units, 6 sites 15  15   cervical paraspinals 20 units, 4 sites 10  10   trapezius 30 units, 6 sites 15  15   additional (follow-the-pain)        TOTAL  155  units      Total units used: 155  Total units wasted: 45    Patient tolerated the procedure without complications or significant blood loss. Post procedure  instructions were provided to the patient and all questions were answered.     Change in Treatment Plan? No    Lot: [id number]  Exp: 01/2025    I spent a total of 10 minutes in face-to-face time with the patient, and in non-face-to-face activities, conducted today directly related to this visit, including reviewing records and tests, obtaining history, performing examination, placing orders, communicating with other healthcare professionals, counseling the patient, family or caregiver, documenting in the medical record, and/or care coordination for the diagnoses above. This is independent of the time spent on the procedure performed today.    ------  Follow up with myself in 10 weeks for Botox #15 - he again notices significant treatment lapse leading up to this injection. Wearing off to near-daily  headache again, need to reduce interval to q10 week dosing.      Robet Leu, MD  Assistant Professor of Neurology  [facility]

## 2022-07-15 NOTE — Telephone Encounter (Signed)
Hi,    At check out as I was scheduling patient for his 10 weeks botox for 10/12 first thing available for Dr. Thelma Comp is for 10/19 which is 11 weeks. How would like me to go by this?     Thank you,  Altamese Dilling

## 2022-09-29 ENCOUNTER — Ambulatory Visit: Admit: 2022-09-29 | Discharge: 2022-09-29 | Payer: PRIVATE HEALTH INSURANCE | Attending: Family | Primary: Person

## 2022-09-29 DIAGNOSIS — G43709 Chronic migraine without aura, not intractable, without status migrainosus: Secondary | ICD-10-CM

## 2022-09-29 MED ORDER — ONABOTULINUMTOXINA 200 UNIT SOLUTION FOR INJECTION
200 | Freq: Once | INTRAMUSCULAR | Status: AC
Start: 2022-09-29 — End: 2022-09-29
  Administered 2022-09-29: 23:00:00 155 [IU] via INTRAMUSCULAR

## 2022-09-29 NOTE — Progress Notes (Signed)
[facility]  Procedure Visit     This is an independent service.   The available consultant for this service is Robet Leu, MD.             Patient presents for Botox PREEMPT protocol treatment today. Dr. Tamera Stands assisted in the procedure today.   They have tried and failed prevention medications from at least 2-3 different classes. Last Botox was on 07/15/22 with Dr. Thelma Comp.     Efficacy:  Patient reports that since starting Botox, headache severity improved by more than 50%, frequency by more than 50%, and duration by more than 50%. Overall patient reports that there has been more than 100 hours of improvement in headache frequency.     For a detailed headache history and a summary of current and prior headache treatments, please review Dr. Andreas Newport most recent non-procedural note.    I reviewed the patient's PMH, FH, and SH as well as current medications and allergies.      Current Outpatient Medications:     ADVAIR DISKUS 250-50 mcg/dose diskus inhaler, 1 puff every 12 (twelve) hours, Disp: , Rfl:     albuterol 90 mcg/actuation metered dose inhaler, Inhale 2 puffs into the lungs daily as needed, Disp: , Rfl:     cetirizine (ZYRTEC) 10 mg tablet, Take 1 tablet (10 mg total) by mouth daily, Disp: , Rfl:     rimegepant (NURTEC ODT) 75 mg rapid dissolve tablet, Place 1 tablet (75 mg total) onto the tongue every other day . Limit 1 tablet (81m) in 24 hours, Disp: 16 tablet, Rfl: 2    triamcinolone (NASACORT AQ) 55 mcg nasal spray, 2 sprays by Nasal route daily, Disp: , Rfl:     lisinopriL (PRINIVIL,ZESTRIL) 10 mg tablet, Take 10 mg by mouth daily (Patient not taking: Reported on 09/29/2022), Disp: , Rfl:     Current Facility-Administered Medications:     onabotulinum toxin A (BOTOX) injection 155 Units, 155 Units, Intramuscular, Once, Janaki Exley Wadenius Emmily Pellegrin, NP    Allergies as of 09/29/2022  Review status set to Review Complete by KBurundiJuarez Gutierrez on 09/29/2022        Severity Noted Reaction Type  Reactions    Tramadol High 04/17/2020   Allergy Nausea And Vomiting          BP 119/74 (BP Location: Left upper arm, Patient Position: Sitting, Cuff Size: Adult)   Pulse 67   Wt 72.6 kg (160 lb)   SpO2 97%   BMI 23.63 kg/m     NEUROLOGIC EXAM  The patient is well appearing and in no acute distress.  Speech is fluent. The face appears symmetric at rest and with excursion. There is no significant atrophy in the frontalis, corrugator, and procerus muscles.  All extremities move symmetrically.  The patient was observed ambulating with normal casual gait and no ataxia.    IMPRESSION  Chronic migraine    We will proceed with onabotulinum toxin A (Botox) injections per the PREEMPT protocol* in order to optimize this patient's treatment plan and improve headache burden, function, and quality of life. The patient meets diagnostic criteria for chronic migraine given the presence of  >/= 15 headache days a month (of which at least 8 are migrainous) for a period of >/= 3 months. Headache episodes have a duration of > 4 hours and are severe in intensity. These episodes are accompanied by migrainous features including one or more of the following: nausea, vomiting, photophobia, phonophobia, osmophobia, movement sensitivity.  Headaches are disabling. The patient has failed prevention medications in at least 2-3 different classes and trials lasted at least 2-3 months (with the exception of shorter trials due to intolerable side effects).  *The Botox PREEMPT protocol is FDA-approved for chronic migraine and uses 155 units of onabotulinumtoxinA diluted in normal saline, distributed in the following injection sites: bilateral corrugators (10 units, divided in 2 sites), procerus (5 units, in 1 site), frontalis (20 units, divided in 4 sites), bilateral temporalis (40 units, divided in 8 sites), bilateral occipitalis (30 units, divided in 6 sites), bilateral cervical paraspinal muscles (20 units, divided in 6 sites), and the  bilateral trapezius (30 units, divided in 6 sites).    PLAN  No planned changes to other headache treatments.    Follow up in 3 months for next Botox treatment.    Dx: chronic migraine   OnabotulinumtoxinA Injections (PREEMPT PROTOCOL)    Severity of headaches:  Severe    This patient's headache condition when diagnosed (start of Botox treatment) met criteria for chronic migraine (including >4 hours in duration, and present on 15 or more days per month).      The risks, benefits and anticipated outcomes of the procedure, the risks and benefits of the alternatives to the procedure, and the roles and tasks of the personnel to be involved, were discussed with the patient.  The patient has given written informed consent to the procedure and agrees to proceed: Yes    UNIVERSAL PROTOCOL / SAFETY CHECKLIST    Procedure to be performed: OnabotulinumtoxinA injection per PREEMPT protocol.    Sign in Communication: Yes    Time Out:  Team Confirms the Correct Patient, Correct Procedure, Correct Site and Site Marking, Correct Position (if applicable), Prep and Dry Time (if applicable).  Time:  67    Affirmation of Time Out: Yes    Sign Out Discussion: Yes    Treatment # 15  Diluent:   Normal saline   Indication:  Chronic migraine     Injection SItes    Muscle                         Fixed Site/Fixed Dose                            L      R          Corrugator                   10 Units divided in 2 sites      5   5       Procerus                       5 Units in 1 site               5     Frontalis                       20 Units divided in 4 sites           10 10    Temporalis                   40 Units divided in 8 sites           20 20        Occipitalis  30 Units divided in 6 sites            15   15        Cervical PSPs             20 Units divided in 4 sites            10   10    Trapezius                     30 Units divided in 6 sites           15   15          Other sites  SCM                                             Rhomboids                       _________  Subtotals                                      155 Units          Total Units used: 155  Total Units wasted: 45  Patient did tolerate procedure.   Change in Treatment Plan? No

## 2022-09-29 NOTE — Patient Instructions (Signed)
Instruction after botox injection:    - If you have any pain or swelling use ice, 20 min on and 20 min off.  Do not rub or massage the area for 48 hrs.    - If you have any neck stiffness, you may use heat and do stretching exercises.    - This should improve over the next 5 days.   - If it does not, call our office at for further instructions.  - Patient expressed understanding of the above

## 2022-09-30 NOTE — Progress Notes (Deleted)
BOTULINUM TOXIN INJECTION  IN-PERSON AND PROCEDURE VISIT NOTE    Patient Name: Ralph Allen   Medical Record #: [id number]   Date: 09/30/2022  DOB: 1996/03/02    Last visit brief summary 07/15/2022: Botox #14. He again noticed significant tx lapse leading up to this injection. Wearing off to near-daily headache again.    VITAL SIGNS:  There were no vitals taken for this visit.    INTERVAL HISTORY  Patient presents for Botox PREEMPT protocol treatment today. ***    This patient's headache condition when diagnosed (start of Botox treatment) met criteria for chronic migraine (including >4 hours in duration, and present on 15 or more days per month). They have tried and failed prevention medications from at least 1-3 different classes.    Botox effective: Patient reports that since starting Botox, headache severity improved by more than 50%, frequency by more than 50%, and duration by more than 50%. Overall patient reports that there has been more than 100 hours of improvement in headache frequency.    For a detailed headache history and a summary of current and prior headache treatments, please review my most recent non-procedural note.    I reviewed the patient's PMH, FH, and SH as well as current medications and allergies.    Current Medications         Dosage    ADVAIR DISKUS 250-50 mcg/dose diskus inhaler 1 puff every 12 (twelve) hours    albuterol 90 mcg/actuation metered dose inhaler Inhale 2 puffs into the lungs daily as needed       cetirizine (ZYRTEC) 10 mg tablet Take 10 mg by mouth daily    lisinopriL (PRINIVIL,ZESTRIL) 10 mg tablet Take 10 mg by mouth daily    rimegepant (NURTEC ODT) 75 mg rapid dissolve tablet Place 1 tablet (75 mg total) onto the tongue every other day . Limit 1 tablet (16m) in 24 hours    triamcinolone (NASACORT AQ) 55 mcg nasal spray 110 mcg by Nasal route daily          Allergies/Contraindications   Allergen Reactions    Tramadol Nausea And Vomiting      NEUROLOGIC EXAM  The  patient is well appearing and in no acute distress.  Speech is fluent. The face appears symmetric at rest and with excursion. There is no significant atrophy in the frontalis, corrugator, and procerus muscles.  All extremities move symmetrically.  The patient was observed ambulating with normal casual gait and no ataxia.    IMPRESSION  Dx: Chronic migraine  We will proceed with onabotulinum toxin A (Botox) injections per the PREEMPT protocol* in order to optimize this patient's treatment plan and improve headache burden, function, and quality of life. The patient meets diagnostic criteria for chronic migraine given the presence of  >/= 15 headache days a month (of which at least 8 are migrainous) for a period of >/= 3 months. Headache episodes have a duration of > 4 hours and are severe in intensity. These episodes are accompanied by migrainous features including one or more of the following: nausea, vomiting, photophobia, phonophobia, osmophobia, movement sensitivity.  Headaches are disabling. The patient has failed prevention medications in at least 2-3 different classes and trials lasted at least 2-3 months (with the exception of shorter trials due to intolerable side effects).  *The Botox PREEMPT protocol is FDA-approved for chronic migraine and uses 155 units of onabotulinumtoxinA diluted in normal saline, distributed in the following injection sites: bilateral corrugators (10 units, divided in 2  sites), procerus (5 units, in 1 site), frontalis (20 units, divided in 4 sites), bilateral temporalis (40 units, divided in 8 sites), bilateral occipitalis (30 units, divided in 6 sites), bilateral cervical paraspinal muscles (20 units, divided in 6 sites), and the bilateral trapezius (30 units, divided in 6 sites).    PLAN  No planned changes to other headache treatments.    Additional recommendations discussed at today's visit include: N/A    Follow up in 10 weeks for next Botox  treatment.    PROCEDURE  OnabotulinumtoxinA Injections (PREEMPT protocol)    Consent Statement:  Date of consent: 09/30/2022   Time of consent: ***PM  The risks, benefits, and anticipated outcomes of the procedure, the risks and benefits of the alternatives to the procedure, and the roles and tasks of the personnel to be involved, were discussed with the patient, and the patient consents to the procedure and agrees to proceed. Ample time was provided to the patient to understand the procedure and provide conset. Questions were asked and answered to patient satisfaction. Written informed consent signed and submitted for scanning into the electronic medical record. If a language barrier was present, a medical interpreter was used.  The patient is not taking medications with anticoagulant properties.    UNIVERSAL PROTOCOL / SAFETY CHECKLIST   Procedure to be performed: OnabotulinumtoxinA injections, PREEMPT protocol   Time Out: The correct patient, procedure, and site were confirmed at ***PM.    Treatment #15  Diluent: normal saline  Indication: chronic migraine     Injection Sites    Muscle Site & Dose Left  Right   corrugators 10 units, 2 sites 5  5   procerus 5 units, 1 site  5    frontalis 20 units, 4 sites 10  10   temporalis 40 units, 8 sites 20  20   occipitalis 30 units, 6 sites 15  15   cervical paraspinals 20 units, 4 sites 10  10   trapezius 30 units, 6 sites 15  15   additional (follow-the-pain)        TOTAL  155  units      Total units used: 155  Total units wasted: 45    Patient tolerated the procedure without complications or significant blood loss. Post procedure instructions were provided to the patient and all questions were answered.     Change in Treatment Plan? No    Lot: ***  Exp: ***    I spent a total of 10 minutes in face-to-face time with the patient, and in non-face-to-face activities, conducted today directly related to this visit, including reviewing records and tests, obtaining history,  performing examination, placing orders, communicating with other healthcare professionals, counseling the patient, family or caregiver, documenting in the medical record, and/or care coordination for the diagnoses above. This is independent of the time spent on the procedure performed today.    ------  Follow up with myself in 10 weeks for Botox #16 given significant tx lapse leading up to injections with each cycle.     Robet Leu, MD  Assistant Professor of Neurology  [place]

## 2022-12-30 ENCOUNTER — Ambulatory Visit: Admit: 2022-12-30 | Discharge: 2023-01-18 | Payer: PRIVATE HEALTH INSURANCE | Attending: Neurology | Primary: Person

## 2022-12-30 DIAGNOSIS — G43709 Chronic migraine without aura, not intractable, without status migrainosus: Secondary | ICD-10-CM

## 2022-12-30 MED ORDER — ONABOTULINUMTOXINA 200 UNIT SOLUTION FOR INJECTION
200 | Freq: Once | INTRAMUSCULAR | Status: AC
Start: 2022-12-30 — End: 2022-12-30
  Administered 2022-12-30: 155 [IU] via INTRAMUSCULAR

## 2022-12-30 NOTE — Patient Instructions (Signed)
Instruction after botox injection:    - If you have any pain or swelling use ice, 20 min on and 20 min off.  Do not rub or massage the area for 48 hrs.    - If you have any neck stiffness, you may use heat and do stretching exercises.    - This should improve over the next 5 days.   - If it does not, call our office at for further instructions.  - Patient expressed understanding of the above

## 2022-12-30 NOTE — Progress Notes (Signed)
Procedure   Diagnosis: chronic migraine  Procedure: onabotulinumtoxinA injections (PREEMPT protocol)  Benefit: greater than 50% reduction in headache frequency and severity    Consent date & time:  12/30/22 at 1545  Discussed procedure risks, benefits, and alternatives. Patient expressed understanding and agreement to proceed with procedure. Written informed consent placed in chart.    Time out: 1545  Correct patient, procedure, and site confirmed    Technique:  Botox reconstituted and diluted to 200 units/4 ml normal saline solution.     Landmark based approach utilized to determine injection sites per phase 3 research evaluating migraine prophylaxis therapy (PREEMPT) protocol. Sites cleansed with alcohol swab and injected per protocol via 30 gauge 1/2 inch needle. Procedure tolerated without adverse reaction.     Sites (units/muscle)   Muscle Left Right   Procerus 1 (5)   Corrugators 1 (5) 1 (5)   Frontalis 2 (10) 2 (10)   Temporalis 4 (20) 4 (20)   Occipitalis 3 (15) 3 (15)   Cervical paraspinals 2 (10) 2 (10)   Trapezius 3 (15) 3 (15)   Other          Total   Waste 31 (155)  (45)

## 2023-04-13 NOTE — Telephone Encounter (Signed)
Patient/Caregiver calling for the following reason:     Pt. is requesting to schedule BOTOX appointment with Lupita Leash, NP.      Per MD's last clinic note  from 12/30/2022,   Return in about 3 months (around 03/31/2023) for Botox with Dr. Johnanna Schneiders.    If OK to schedule with Annika, NP, please reach out to pt. directly and schedule.      Preferred mode of communication is Phone number on file.  Preferred phone number:  [phone]    Molli Knock to leave message Yes.      [facility]

## 2023-04-25 ENCOUNTER — Ambulatory Visit: Admit: 2023-04-25 | Discharge: 2023-04-25 | Payer: PRIVATE HEALTH INSURANCE | Attending: Family | Primary: Person

## 2023-04-25 DIAGNOSIS — G43709 Chronic migraine without aura, not intractable, without status migrainosus: Secondary | ICD-10-CM

## 2023-04-25 MED ADMIN — onabotulinum toxin A (BOTOX) injection 155 Units: 155 [IU] | INTRAMUSCULAR | @ 18:00:00 | NDC 00023392102

## 2023-04-25 NOTE — Progress Notes (Signed)
[facility]  Procedure and Follow Up Visit     This is an independent service.   The available consultant for this service is Charlott Holler, MD.             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     He presents for Botox PREEMPT protocol treatment today.   They have tried and failed prevention medications from at least 2-3 different classes. Last Botox was on 12/30/22 with Dr. Johnanna Schneiders.      Efficacy:  Patient reports that since starting Botox, headache severity improved by more than 50%, frequency by more than 50%, and duration by more than 50%. Overall patient reports that there has been more than 100 hours of improvement in headache frequency.      For a detailed headache history and a summary of current and prior headache treatments, please review Dr. Allyn Kenner most recent non-procedural note.   Ralph Allen has a transfer visit scheduled with Dr. Remus Loffler on 06/24/23. He also has been seeing Dr. Robley Fries at [facility] for follow up and co-management of Headache care.     Her Assessment and Plan at their last visit on 02/08/23:   1. Chronic migraine (G43.709)   Advised given setback with exercise and anxiety related to  job change, take Nurtec on schedule QOD for next two months  If that is IE, would next consider daily Qulipta for additional prevention   2. Word finding difficulty (R47.89)   Reassured pt that description of transient word finding is not concerning for neurologic process or cognitive impairment  Discussed this may be related to concentration and anxiety   3. Anxiety (F41.9)   Discussed how CV exercise is helpful, discussed swimming as option while ACL is healing  Pt following with psychiatry and  therapist     FU in 6 months, message prn if frequency increases     He notes that typically headaches tend to worsen close to when Botox is due, however this most recent round has not been as bad. No new headache features or symptoms.    I reviewed the patient's PMH, FH, and SH as well as current medications and allergies.      Current Outpatient Medications:     ADVAIR DISKUS 250-50 mcg/dose diskus inhaler, 1 puff every 12 (twelve) hours, Disp: , Rfl:     albuterol 90 mcg/actuation metered dose inhaler, Inhale 2 puffs into the lungs daily as needed, Disp: , Rfl:     cetirizine (ZYRTEC) 10 mg tablet, Take 1 tablet (10 mg total) by mouth daily, Disp: , Rfl:     rimegepant (NURTEC ODT) 75 mg rapid dissolve tablet, Place 1 tablet (75 mg total) onto the tongue every other day . Limit 1 tablet (75mg ) in 24 hours, Disp: 16 tablet, Rfl: 2    triamcinolone (NASACORT AQ) 55 mcg nasal spray, Use 2 sprays in nostril(s) daily, Disp: , Rfl:     lisinopriL (PRINIVIL,ZESTRIL) 10 mg tablet, Take 10 mg by mouth daily (Patient not taking: Reported on 12/30/2022), Disp: , Rfl:     Current Facility-Administered Medications:     onabotulinum toxin A (BOTOX) injection 155 Units, 155 Units, Intramuscular, Once, Slyvester Latona Wadenius Yarixa Lightcap, NP  Allergies as of 04/25/2023  Review status set to Review Complete  by Arta Bruce on 04/25/2023        Severity Noted Reaction Type Reactions    Tramadol High 04/17/2020   Allergy Nausea And Vomiting          BP (!) 128/88 (BP Location: Left upper arm, Patient Position: Sitting, Cuff Size: Adult)   Pulse 73   Ht 175.3 cm (5\' 9" )   Wt 74.8 kg (165 lb)   SpO2 97%   BMI 24.37 kg/m   NEUROLOGIC EXAM  The patient is well appearing and in no acute distress.  Speech is fluent. The face appears symmetric at rest and with excursion. There is no significant atrophy in the frontalis, corrugator, and procerus muscles.  All extremities move symmetrically.  The patient was observed ambulating with normal  casual gait and no ataxia.     IMPRESSION  Chronic migraine     We will proceed with onabotulinum toxin A (Botox) injections per the PREEMPT protocol* in order to optimize this patient's treatment plan and improve headache burden, function, and quality of life. The patient meets diagnostic criteria for chronic migraine given the presence of  >/= 15 headache days a month (of which at least 8 are migrainous) for a period of >/= 3 months. Headache episodes have a duration of > 4 hours and are severe in intensity. These episodes are accompanied by migrainous features including one or more of the following: nausea, vomiting, photophobia, phonophobia, osmophobia, movement sensitivity.  Headaches are disabling. The patient has failed prevention medications in at least 2-3 different classes and trials lasted at least 2-3 months (with the exception of shorter trials due to intolerable side effects).  *The Botox PREEMPT protocol is FDA-approved for chronic migraine and uses 155 units of onabotulinumtoxinA diluted in normal saline, distributed in the following injection sites: bilateral corrugators (10 units, divided in 2 sites), procerus (5 units, in 1 site), frontalis (20 units, divided in 4 sites), bilateral temporalis (40 units, divided in 8 sites), bilateral occipitalis (30 units, divided in 6 sites), bilateral cervical paraspinal muscles (20 units, divided in 6 sites), and the bilateral trapezius (30 units, divided in 6 sites).     PLAN  No planned changes to other headache treatments.     Video Visit (transfer) as scheduled with Dr. Remus Loffler on 06/24/23 at 11 am    Recommended he follow up with neurosurgery as planned and recommended by them (Dr. Salley Slaughter).  Ophthalmology (routine visit) as scheduled with them tomorrow 04/26/23.    Follow up in 3 months for next Botox treatment.    I, Cipriano Mile, NP, spent a total of 15 minutes on this patient's care on the day of their visit excluding time spent related to any  billed procedures. This time includes time spent with the patient as well as time spent documenting in the medical record, reviewing patient's records and tests, obtaining history, placing orders, communicating with other healthcare professionals, counseling the patient, family, or caregiver, and/or care coordination for the diagnoses above.      Dx: chronic migraine   OnabotulinumtoxinA Injections (PREEMPT PROTOCOL)    Severity of headaches:  Severe    This patient's headache condition when diagnosed (start of Botox treatment) met criteria for chronic migraine (including >4 hours in duration, and present on 15 or more days per month).      The risks, benefits and anticipated outcomes of the procedure, the risks and benefits of the alternatives to the procedure, and the roles and tasks of the personnel to  be involved, were discussed with the patient.  The patient has given written informed consent to the procedure and agrees to proceed: Yes    UNIVERSAL PROTOCOL / SAFETY CHECKLIST    Procedure to be performed: OnabotulinumtoxinA injection per PREEMPT protocol.    Sign in Communication: Yes    Time Out:  Team Confirms the Correct Patient, Correct Procedure, Correct Site and Site Marking, Correct Position (if applicable), Prep and Dry Time (if applicable).  Time:  1030    Affirmation of Time Out: Yes    Sign Out Discussion: Yes    Treatment # 17  Diluent:   Normal saline   Indication:  Chronic migraine     Injection SItes    Muscle                         Fixed Site/Fixed Dose                            L      R          Corrugator                   10 Units divided in 2 sites      5   5       Procerus                       5 Units in 1 site               5     Frontalis                       20 Units divided in 4 sites           10 10    Temporalis                   40 Units divided in 8 sites           20 20        Occipitalis                    30 Units divided in 6 sites            15   15        Cervical PSPs              20 Units divided in 4 sites            10   10    Trapezius                     30 Units divided in 6 sites           15   15          Other sites  SCM                                            Rhomboids                       _________  Loretha Stapler  155 Units          Total Units used: 155  Total Units wasted: 45  Patient did tolerate procedure.   Change in Treatment Plan? No

## 2023-04-25 NOTE — Patient Instructions (Signed)
Instruction after botox injection:    - If you have any pain or swelling use ice, 20 min on and 20 min off.  Do not rub or massage the area for 48 hrs.    - If you have any neck stiffness, you may use heat and do stretching exercises.    - This should improve over the next 5 days.   - If it does not, call our office at for further instructions.  - Patient expressed understanding of the above

## 2023-06-30 NOTE — Progress Notes (Signed)
Erroneous. Patient refused to be seen

## 2023-07-01 NOTE — Telephone Encounter (Signed)
Followed up with patient in regards to his Botox appointment on 07/26/23 with Dr. Vickki Hearing and our clinic policy.    Informed patient he cannot continue his Botox unless he establishes care with an attending. He understood and expressed his frustration since Dr. Virgina Norfolk left our clinic. I let him know his 07/26/23 Botox appointment with Dr. Vickki Hearing will be rescheduled considering the interaction yesterday. Patient understood.     Explained our policy again and patient understood. He would like to be transferred to another attending - strictly only an MD.

## 2023-08-10 ENCOUNTER — Ambulatory Visit: Admit: 2023-08-10 | Discharge: 2023-08-10 | Payer: PRIVATE HEALTH INSURANCE | Attending: Neurology | Primary: Person

## 2023-08-10 DIAGNOSIS — G43709 Chronic migraine without aura, not intractable, without status migrainosus: Secondary | ICD-10-CM

## 2023-08-10 MED ORDER — ESCITALOPRAM 10 MG TABLET
10 | Freq: Every day | ORAL | Status: AC
Start: 2023-08-10 — End: ?

## 2023-08-10 MED ORDER — ONABOTULINUMTOXINA 200 UNIT SOLUTION FOR INJECTION
200 | Freq: Once | INTRAMUSCULAR | Status: AC
Start: 2023-08-10 — End: 2023-08-10
  Administered 2023-08-10: 23:00:00 155 [IU] via INTRAMUSCULAR

## 2023-08-10 NOTE — Progress Notes (Signed)
[facility]  Procedure Visit     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     He presents for Botox PREEMPT protocol treatment today.   They have tried and failed prevention medications from at least 2-3 different classes. Last Botox was on 04/25/23 with Jacklynn Bue, NP     Efficacy:  Patient reports that since starting Botox, headache severity improved by more than 50%, frequency by more than 50%, and duration by more than 50%. Overall patient reports that there has been more than 100 hours of improvement in headache frequency.      For a detailed headache history and a summary of current and prior headache treatments, please review Dr. Allyn Kenner most recent non-procedural note.     He notes that typically headaches tend to worsen close to when Botox is due, however this most recent round has not been as bad. No new headache features or symptoms.    I reviewed the patient's PMH, FH, and SH as well as current medications and allergies.    NEUROLOGIC EXAM  The patient is well appearing and in no acute distress.  Speech is fluent. The face appears symmetric at rest and with excursion. There is no significant atrophy in the frontalis, corrugator, and procerus muscles.  All extremities move symmetrically.  The patient was observed ambulating with normal casual gait and no ataxia.     IMPRESSION  Chronic migraine     We will proceed with onabotulinum toxin A (Botox) injections per the PREEMPT protocol* in order to optimize this patient's treatment plan and improve headache burden, function, and quality of life. The patient meets diagnostic criteria for chronic migraine given the presence of  >/= 15 headache days a month (of which at least 8  are migrainous) for a period of >/= 3 months. Headache episodes have a duration of > 4 hours and are severe in intensity. These episodes are accompanied by migrainous features including one or more of the following: nausea, vomiting, photophobia, phonophobia, osmophobia, movement sensitivity.  Headaches are disabling. The patient has failed prevention medications in at least 2-3 different classes and trials lasted at least 2-3 months (with the exception of shorter trials due to intolerable side effects).  *The Botox PREEMPT protocol is FDA-approved for chronic migraine and uses 155 units of onabotulinumtoxinA diluted in normal saline, distributed in the following injection sites: bilateral corrugators (10 units, divided in 2 sites), procerus (5 units, in 1 site), frontalis (20 units, divided in 4 sites), bilateral temporalis (40 units, divided in 8 sites), bilateral occipitalis (30 units, divided in 6 sites), bilateral cervical paraspinal muscles (20 units, divided in 6 sites), and the bilateral trapezius (30 units, divided in 6 sites).     PLAN  No planned changes to other headache treatments.     Dx: chronic migraine   OnabotulinumtoxinA Injections (PREEMPT PROTOCOL)    Severity of headaches:  Severe    This patient's headache condition when diagnosed (start of Botox treatment) met criteria for chronic migraine (including >4 hours in duration, and present on 15 or more days per month).      The risks, benefits and anticipated outcomes of the procedure, the risks and benefits of the alternatives to the procedure, and the roles and tasks of the personnel to be  involved, were discussed with the patient.  The patient has given written informed consent to the procedure and agrees to proceed: Yes    UNIVERSAL PROTOCOL / SAFETY CHECKLIST    Procedure to be performed: OnabotulinumtoxinA injection per PREEMPT protocol.    Sign in Communication: Yes    Time Out:  Team Confirms the Correct Patient, Correct Procedure, Correct  Site and Site Marking, Correct Position (if applicable), Prep and Dry Time (if applicable).  Time:  3:55 PM    Affirmation of Time Out: Yes    Sign Out Discussion: Yes    Treatment # 18  Diluent:   Normal saline   Indication:  Chronic migraine     Injection SItes    Muscle                         Fixed Site/Fixed Dose                            L      R          Corrugator                   10 Units divided in 2 sites      5   5       Procerus                       5 Units in 1 site               5     Frontalis                       20 Units divided in 4 sites           10 10    Temporalis                   40 Units divided in 8 sites           20 20        Occipitalis                    30 Units divided in 6 sites            15   15        Cervical PSPs             20 Units divided in 4 sites            10   10    Trapezius                     30 Units divided in 6 sites           15   15          Other sites  SCM                                            Rhomboids                       _________  Loretha Stapler  155 Units          Total Units used: 155  Total Units wasted: 45  Patient did tolerate procedure.   Change in Treatment Plan? No

## 2023-11-02 ENCOUNTER — Ambulatory Visit: Admit: 2023-11-02 | Discharge: 2023-11-02 | Payer: PRIVATE HEALTH INSURANCE | Attending: Family | Primary: Person

## 2023-11-02 DIAGNOSIS — G43709 Chronic migraine without aura, not intractable, without status migrainosus: Secondary | ICD-10-CM

## 2023-11-02 MED ORDER — ONABOTULINUMTOXINA 200 UNIT SOLUTION FOR INJECTION
200 | Freq: Once | INTRAMUSCULAR | Status: AC
Start: 2023-11-02 — End: 2023-11-02
  Administered 2023-11-02: 22:00:00 155 [IU] via INTRAMUSCULAR

## 2023-11-02 NOTE — Patient Instructions (Signed)
 Instruction after botox injection:    - If you have any pain or swelling use ice, 20 min on and 20 min off.  Do not rub or massage the area for 48 hrs.    - If you have any neck stiffness, you may use heat and do stretching exercises.    - This should improve over the next 5 days.   - If it does not, call our office at for further instructions.  - Patient expressed understanding of the above

## 2023-11-02 NOTE — Progress Notes (Signed)
[facility]  Procedure Visit     Mr. 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      He presents for Botox PREEMPT protocol treatment today.   They have tried and failed prevention medications from at least 2-3 different classes. Last Botox was on 08/10/23 with Dr. Dorothe Pea.      Efficacy:  Patient reports that since starting Botox, headache severity improved by more than 50%, frequency by more than 50%, and duration by more than 50%. Overall patient reports that there has been more than 100 hours of improvement in headache frequency.      For a detailed headache history and a summary of current and prior headache treatments, please review Dr. Allyn Kenner most recent non-procedural note.      He notes that typically headaches tend to worsen close to when Botox is due, however this most recent round has not been as bad. No new headache features or symptoms.    Neurosurgery contacted the patient on 09/21/23 as he is due for MRI in December, he is planning to follow up.      I reviewed the patient's PMH, FH, and SH as well as current medications and allergies.      Current Outpatient Medications:     ADVAIR DISKUS 250-50 mcg/dose diskus inhaler, 1 puff every 12 (twelve) hours, Disp: , Rfl:     albuterol 90 mcg/actuation metered dose inhaler, Inhale 2 puffs into the lungs daily as needed, Disp: , Rfl:     cetirizine (ZYRTEC) 10 mg tablet, Take 1 tablet (10 mg total) by mouth daily, Disp: , Rfl:     escitalopram oxalate (LEXAPRO) 10 mg tablet, Take 1 tablet (10 mg total) by mouth daily, Disp: , Rfl:     lisinopriL (PRINIVIL,ZESTRIL) 10 mg tablet, Take 10 mg by mouth daily (Patient not taking: Reported on 12/30/2022), Disp: , Rfl:     rimegepant  (NURTEC ODT) 75 mg rapid dissolve tablet, Place 1 tablet (75 mg total) onto the tongue every other day . Limit 1 tablet (75mg ) in 24 hours, Disp: 16 tablet, Rfl: 2    triamcinolone (NASACORT AQ) 55 mcg nasal spray, Use 2 sprays in nostril(s) daily, Disp: , Rfl:     Current Facility-Administered Medications:     onabotulinum toxin A (BOTOX) injection 155 Units, 155 Units, Intramuscular, Once, Lilinoe Acklin Wadenius Chalee Hirota, NP  Allergies as of 11/02/2023  Review status set to Review Complete by Konrad Felix, RN on 11/02/2023        Severity Noted Reaction Type Reactions    Tramadol High 04/17/2020   Allergy Nausea And Vomiting          BP 106/53 (BP Location: Left upper arm, Patient Position: Sitting, Cuff Size: Large Adult)   Pulse 64   Resp 18   Ht 175.3 cm (5\' 9" )   Wt 74.8 kg (165 lb)   BMI 24.37 kg/m   NEUROLOGIC EXAM  The patient is well appearing and in no acute distress.  Speech is fluent. The face appears symmetric at rest and with excursion. There is no significant atrophy in the frontalis, corrugator, and procerus muscles.  All extremities move symmetrically.  The patient was observed ambulating with normal casual gait and no ataxia.     IMPRESSION  Chronic migraine  We will proceed with onabotulinum toxin A (Botox) injections per the PREEMPT protocol* in order to optimize this patient's treatment plan and improve headache burden, function, and quality of life. The patient meets diagnostic criteria for chronic migraine given the presence of  >/= 15 headache days a month (of which at least 8 are migrainous) for a period of >/= 3 months. Headache episodes have a duration of > 4 hours and are severe in intensity. These episodes are accompanied by migrainous features including one or more of the following: nausea, vomiting, photophobia, phonophobia, osmophobia, movement sensitivity.  Headaches are disabling. The patient has failed prevention medications in at least 2-3 different classes and trials lasted  at least 2-3 months (with the exception of shorter trials due to intolerable side effects).  *The Botox PREEMPT protocol is FDA-approved for chronic migraine and uses 155 units of onabotulinumtoxinA diluted in normal saline, distributed in the following injection sites: bilateral corrugators (10 units, divided in 2 sites), procerus (5 units, in 1 site), frontalis (20 units, divided in 4 sites), bilateral temporalis (40 units, divided in 8 sites), bilateral occipitalis (30 units, divided in 6 sites), bilateral cervical paraspinal muscles (20 units, divided in 6 sites), and the bilateral trapezius (30 units, divided in 6 sites).     PLAN  No planned changes to other headache treatments.    Recommended he follow up with neurosurgery regarding MRI to be scheduled in December 2024, he agreed.     Follow up as scheduled for Botox with Dr. Casimiro Needle on 02/01/24, and for video transfer visit with Dr. Casimiro Needle on 02/22/24 at 9 am    APP Visit Information:     APP Service Type:  Independent  Available MD consultant:  Charlott Holler, MD    I spent a total of 10 non-overlapping minutes on this patient's care on the day of their visit excluding time spent related to any billed procedures. This time includes time spent with the patient as well as time spent documenting in the medical record, reviewing patient's records and tests, obtaining history, placing orders, communicating with other healthcare professionals, counseling the patient, family, or caregiver, and/or care coordination for the diagnoses above.      Dx: chronic migraine   OnabotulinumtoxinA Injections (PREEMPT PROTOCOL)    Severity of headaches:  Severe    This patient's headache condition when diagnosed (start of Botox treatment) met criteria for chronic migraine (including >4 hours in duration, and present on 15 or more days per month).      The risks, benefits and anticipated outcomes of the procedure, the risks and benefits of the alternatives to the procedure, and the  roles and tasks of the personnel to be involved, were discussed with the patient.  The patient has given written informed consent to the procedure and agrees to proceed: Yes    UNIVERSAL PROTOCOL / SAFETY CHECKLIST    Procedure to be performed: OnabotulinumtoxinA injection per PREEMPT protocol.    Sign in Communication: Yes    Time Out:  Team Confirms the Correct Patient, Correct Procedure, Correct Site and Site Marking, Correct Position (if applicable), Prep and Dry Time (if applicable).  Time:  1410    Affirmation of Time Out: Yes    Sign Out Discussion: Yes    Treatment # 19  Diluent:   Normal saline   Indication:  Chronic migraine     Injection SItes    Muscle  Fixed Site/Fixed Dose                            L      R          Corrugator                   10 Units divided in 2 sites      5   5       Procerus                       5 Units in 1 site               5     Frontalis                       20 Units divided in 4 sites           10 10    Temporalis                   40 Units divided in 8 sites           20 20        Occipitalis                    30 Units divided in 6 sites            15   15        Cervical PSPs             20 Units divided in 4 sites            10   10    Trapezius                     30 Units divided in 6 sites           15   15          Other sites  SCM                                            Rhomboids                       _________  Subtotals                                      155 Units          Total Units used: 155  Total Units wasted: 45  Patient did tolerate procedure.   Change in Treatment Plan? No

## 2024-02-01 ENCOUNTER — Ambulatory Visit: Admit: 2024-02-01 | Discharge: 2024-02-01 | Payer: PRIVATE HEALTH INSURANCE | Attending: Neurology | Primary: Person

## 2024-02-01 DIAGNOSIS — G43709 Chronic migraine without aura, not intractable, without status migrainosus: Secondary | ICD-10-CM

## 2024-02-01 MED ORDER — ONABOTULINUMTOXINA 200 UNIT SOLUTION FOR INJECTION
200 | Freq: Once | INTRAMUSCULAR | Status: AC
Start: 2024-02-01 — End: 2024-02-01
  Administered 2024-02-01: 155 [IU] via INTRAMUSCULAR

## 2024-02-01 NOTE — Progress Notes (Signed)
[facility]  Procedure Visit     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     He presents for Botox PREEMPT protocol treatment today.   They have tried and failed prevention medications from at least 2-3 different classes. Last Botox was on 08/10/23 with me     Efficacy:  Patient reports that since starting Botox, headache severity improved by more than 50%, frequency by more than 50%, and duration by more than 50%. Overall patient reports that there has been more than 100 hours of improvement in headache frequency.      For a detailed headache history and a summary of current and prior headache treatments, please review Dr. Allyn Kenner most recent non-procedural note.     He notes that typically headaches tend to worsen close to when Botox is due, however this most recent round has not been as bad. No new headache features or symptoms.    I reviewed the patient's PMH, FH, and SH as well as current medications and allergies.    NEUROLOGIC EXAM  The patient is well appearing and in no acute distress.  Speech is fluent. The face appears symmetric at rest and with excursion. There is no significant atrophy in the frontalis, corrugator, and procerus muscles.  All extremities move symmetrically.  The patient was observed ambulating with normal casual gait and no ataxia.     IMPRESSION  Chronic migraine     We will proceed with onabotulinum toxin A (Botox) injections per the PREEMPT protocol* in order to optimize this patient's treatment plan and improve headache burden, function, and quality of life. The patient meets diagnostic criteria for chronic migraine given the presence of  >/= 15 headache days a month (of which at least 8 are migrainous)  for a period of >/= 3 months. Headache episodes have a duration of > 4 hours and are severe in intensity. These episodes are accompanied by migrainous features including one or more of the following: nausea, vomiting, photophobia, phonophobia, osmophobia, movement sensitivity.  Headaches are disabling. The patient has failed prevention medications in at least 2-3 different classes and trials lasted at least 2-3 months (with the exception of shorter trials due to intolerable side effects).  *The Botox PREEMPT protocol is FDA-approved for chronic migraine and uses 155 units of onabotulinumtoxinA diluted in normal saline, distributed in the following injection sites: bilateral corrugators (10 units, divided in 2 sites), procerus (5 units, in 1 site), frontalis (20 units, divided in 4 sites), bilateral temporalis (40 units, divided in 8 sites), bilateral occipitalis (30 units, divided in 6 sites), bilateral cervical paraspinal muscles (20 units, divided in 6 sites), and the bilateral trapezius (30 units, divided in 6 sites).     PLAN  No planned changes to other headache treatments.    He does have a video visit transfer of care appointment scheduled with me on 02/22/24     Dx: chronic migraine   OnabotulinumtoxinA Injections (PREEMPT PROTOCOL)    Severity of headaches:  Severe    This patient's headache condition when diagnosed (start of Botox treatment) met criteria for chronic migraine (including >4 hours in duration, and present on 15 or more days per month).      The risks, benefits and anticipated outcomes of the procedure, the risks and benefits  of the alternatives to the procedure, and the roles and tasks of the personnel to be involved, were discussed with the patient.  The patient has given written informed consent to the procedure and agrees to proceed: Yes    UNIVERSAL PROTOCOL / SAFETY CHECKLIST    Procedure to be performed: OnabotulinumtoxinA injection per PREEMPT protocol.    Sign in Communication:  Yes    Time Out:  Team Confirms the Correct Patient, Correct Procedure, Correct Site and Site Marking, Correct Position (if applicable), Prep and Dry Time (if applicable).  Time:  3:39 PM    Affirmation of Time Out: Yes    Sign Out Discussion: Yes    Treatment # 19  Diluent:   Normal saline   Indication:  Chronic migraine     Injection SItes    Muscle                         Fixed Site/Fixed Dose                            L      R          Corrugator                   10 Units divided in 2 sites      5   5       Procerus                       5 Units in 1 site               5     Frontalis                       20 Units divided in 4 sites           10 10    Temporalis                   40 Units divided in 8 sites           20 20        Occipitalis                    30 Units divided in 6 sites            15   15        Cervical PSPs             20 Units divided in 4 sites            10   10    Trapezius                     30 Units divided in 6 sites           15   15          Other sites  SCM                                            Rhomboids                       _________  Loretha Stapler  155 Units          Total Units used: 155  Total Units wasted: 45  Patient did tolerate procedure.   Change in Treatment Plan? No

## 2024-02-06 DIAGNOSIS — C719 Malignant neoplasm of brain, unspecified: Secondary | ICD-10-CM

## 2024-02-06 MED ORDER — GADOTERATE MEGLUMINE 0.5 MMOL/ML (376.9 MG/ML) INTRAVENOUS SOLUTION
0.5376.9 | Freq: Once | INTRAVENOUS | Status: AC
Start: 2024-02-06 — End: 2024-02-06
  Administered 2024-02-07: 02:00:00 15 mL/kg via INTRAVENOUS

## 2024-02-07 ENCOUNTER — Inpatient Hospital Stay: Admit: 2024-02-07 | Payer: PRIVATE HEALTH INSURANCE | Primary: Person

## 2024-02-16 ENCOUNTER — Ambulatory Visit: Payer: PRIVATE HEALTH INSURANCE | Attending: Physician | Primary: Person

## 2024-02-16 DIAGNOSIS — C719 Malignant neoplasm of brain, unspecified: Secondary | ICD-10-CM

## 2024-02-16 NOTE — Progress Notes (Addendum)
 APP Visit Information:   APP Service Type:  Shared    I spent a total of 5 non-overlapping minutes on this patient's care on the day of their visit excluding time spent related to any billed procedures. This time includes time spent with the patient as well as time spent documenting in the medical record, reviewing patient's records and tests, obtaining history, placing orders, communicating with other healthcare professionals, counseling the patient, family, or caregiver, and/or care coordination for the diagnoses above.       I performed this evaluation using real-time telehealth tools, including a live video Zoom connection between my location and the patient's location. Prior to initiating, the patient consented to perform this evaluation using telehealth tools.  Subjective    Ralph Allen is a 28 y.o. male who presents with the following:    Chief Complaint              New Patient Evaluation           History of Present Illness   Ralph Allen is a 28 y.o. male with h/o chiari type I, chronic migraine headaches, s/p  endoscopic third ventriculostomy on December 13, 2012 for right medial thalamus pilocytic astrocytoma and associated hydrocephalus, who presents today for a new patient evaluation as last seen in 2021.  Patient had film review for MRI 11/17/2021 that revealed a stable imaging with plans to follow-up with an MRI in 2 years.  Patient followed up with Dr. Dorothe Pea, neurology,  on 02/01/24 for Botox PREEMPT protocol treatment for her migraines. No planned changes were made to his headache treatments.  She has a follow-up with Dr. Casimiro Needle on 02/22/2024.    Dr. Salley Slaughter reviewed MRI scan with patient in detail, plan is for next surveillance scan in 3 years.  Patient wanted to review imaging report  with Dr. Salley Slaughter, he went over imaging report with patient. Patient was concerned about the redemonstrated foci of abnormal signals which Dr. Salley Slaughter mentioned could be hamartomatous lesions.   Patient agreeable to plan. Dr. Salley Slaughter answered all his questions. No other questions or concerns at this time.       Allergies/Contraindications   Allergen Reactions    Tramadol Nausea And Vomiting     Outpatient Encounter Medications as of 02/16/2024   Medication Sig Dispense Refill    ADVAIR DISKUS 250-50 mcg/dose diskus inhaler 1 puff every 12 (twelve) hours      albuterol 90 mcg/actuation metered dose inhaler Inhale 2 puffs into the lungs daily as needed      cetirizine (ZYRTEC) 10 mg tablet Take 1 tablet (10 mg total) by mouth daily      escitalopram oxalate (LEXAPRO) 10 mg tablet Take 1 tablet (10 mg total) by mouth daily      lisinopriL (PRINIVIL,ZESTRIL) 10 mg tablet Take 10 mg by mouth daily (Patient not taking: Reported on 02/01/2024)      rimegepant (NURTEC ODT) 75 mg rapid dissolve tablet Place 1 tablet (75 mg total) onto the tongue every other day . Limit 1 tablet (75mg ) in 24 hours 16 tablet 2    triamcinolone (NASACORT AQ) 55 mcg nasal spray Use 2 sprays in nostril(s) daily       No facility-administered encounter medications on file as of 02/16/2024.     No past medical history on file.    No past surgical history on file.  No family history on file.    Social History     Tobacco Use  Smoking status: Former     Types: Cigarettes, Cigars    Smokeless tobacco: Never   Substance and Sexual Activity    Alcohol use: Yes    Drug use: Not Currently     Types: Marijuana           Objective          Physical Exam  Exam limited due to being a video visit.  The patient is awake, alert and oriented without speech or memory deficit.  Face symmetric and hearing intact.        Review of Prior Testing  MRI Brain with and without Contrast 02/06/24 [facility]  IMPRESSION:   1. Compared to 11/17/2021, no evidence of tumor progression. Overall unchanged enhancing nodule centered in the right dorsomedial thalamus/superior colliculus.   2. Redemonstrated foci of abnormal signal in the right globus pallidus, right amygdala, thalami  and left caudothalamic groove. Slight differences in signal intensity as detailed above likely reflect differences in technique compared to 11/17/2021. Again, these may reflect underlying dysplastic or hamartomatous lesions.     Report dictated by: Joneen Caraway, MD, signed by: Joneen Caraway, MD  Department of Radiology and Biomedical Imaging      MRI Brain with and without Contrast 11/17/21 [place]  IMPRESSION:      1.  Compared to 08/13/2020, no evidence of tumor progression with unchanged size of the enhancing nodule centered in the right dorsomedial thalamus/superior colliculus.  2.  Accounting for differences in imaging technique, unchanged foci of abnormal signal in the right globus pallidus, right amygdala, thalami, and left caudothalamic groove, suspicious for dysplastic or hamartomatous lesions as before.     Report dictated by: Zigmund Gottron, MD, signed by: Rachael Darby, MD, PhD  Department of Radiology and Biomedical Imaging      MRI Brain with and without Contrast 08/13/20 [place]  IMPRESSION:      1.  Since 07/16/2019, no tumor progression. 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     2.  Unchanged nonenhancing FLAIR hyperintense signal in the right globus pallidus, right amygdala, bilateral thalamic pulvinar nuclei, and left caudothalamic groove, suspicious for dysplastic or hamartomatous lesions.     Report dictated by: Danelle Berry, MD, signed by: Renea Ee, MD  Department of Radiology and Biomedical Imaging      The above scribed documentation accurately reflects the services I have provided.    Ralph Allen   02/10/2024 2:04 PM     This is a shared visit for services provided by me, Marcelina Morel, MD. I performed a face-to-face encounter with the patient and the following portion of the note is my own.     Assessment and Plan       Ralph Allen is a 28  y.o. male with a stable pilocytic astrocytoma.  I will see him in 3 years with a follow up MRI.    Thank you for allowing me to participate in this patient's care and do not hesitate to contact me if you have additional questions regarding this patient or other adult brain tumor patients in your care.     I spent a total of 30 minutes with the patient and 25 minutes of that time was spent counseling.  Counseling topics addressed included symptoms, treatment plan, healthcare maintenance as above.

## 2024-02-20 NOTE — Telephone Encounter (Signed)
 Left voicemail for patient to respond to MyChart message sent on 02/17/2024.

## 2024-02-22 ENCOUNTER — Telehealth: Admit: 2024-02-22 | Discharge: 2024-02-22 | Payer: PRIVATE HEALTH INSURANCE | Attending: Neurology | Primary: Person

## 2024-02-22 DIAGNOSIS — G43709 Chronic migraine without aura, not intractable, without status migrainosus: Secondary | ICD-10-CM

## 2024-02-22 NOTE — Progress Notes (Signed)
 [facility] In-Home Tele-Medicine visit    Patient consents to being seen in her home via teleconferencing? Yes  By consenting, the patinet understands that they are responsible for their privacy surrounding themand [place] guarantees that this visit complies with all HIPPA standards. I performed this consultation using real-time telehealth tools, including a live video connection between my location (Office/Clinic) and the patient's location. Prior to initiating the consultation, I obtained the patient's informed verbal consent to perform this consultation using telehealth tools and answered all the questions the patient had about the telehealth interaction.      Referring Physician  [facility]  [place],  [place] [address]    PCP:  [facility]    CC:  Headaches     History of Present Illness    The patient is a 28 year old male with a history of headaches, presenting for follow-up. The patient reports a significant reduction in headache frequency over the past year with the current regimen of Botox and Nurtec. Previously, he experienced headaches on approximately 25 out of 30 days per month. Currently, he reports about 5 to 7 headache days per month, with few being debilitating. He states, "I am happy at where my headaches are today."    He describes his headaches as brief in duration, with frequency being the primary issue leading to a diagnosis of chronic migraine. He notes that the headaches do not significantly impact his daily life, stating, "I've done a pretty good job being able to power through them."    He uses Nurtec primarily as an abortive treatment, taking it 2 to 3 times per month. He finds it effective, describing it as "very fast" and "aggressive." He has used Nurtec as a preventative in the past but has not needed to do so for at least two years.  He has tried various medications in the past, including triptans and gabapentin, before starting Nurtec and Botox during his  junior year of college. He expresses satisfaction with his current treatment regimen and does not see a need for changes at this time.         I obtained consent from the Patient or Surrogate Decision Maker, as well as from all individuals accompanying the patient, to record and utilize a transcription to assist with the creation of documentation of the visit. I also obtained consent from any others recorded during the encounter.    Answers submitted by the patient for this visit:  Headache Questionnaire (Submitted on 02/15/2024)  Age of onset (years): 16  When did headaches become a problem for you? : 16  Initial event: head trauma or concussion, stressful life event, surgery  How many days per month do you get headaches of any kind? : 8  Pain severity: 3  What is the severity or intensity of pain on the worst days?: 6  Duration: 3 Hours  Laterality: both sides at the same time  Location: temples/sides, front/forehead  Quality: dull  What symptoms come with the headaches?: difficulty thinking, memory, change in mood  When you have a headache, what do you prefer to do or feel like doing most often? : position change, rest  What seems to trigger your headaches?: not enough sleep, dehydration, not eating, stress, valsalva, noise, other  Changes in thinking and mood: none  Changes in vision: none  Changes in sensation: none      Headache History (per Dr. Allyn Kenner last note and confirmed with patient today):  [name]'s headaches  began around 8th grade. Following a couple of concussions with worsening headaches he eventually underwent brain imaging and was found to have a brain tumor and hydrocephalus (age 60) s/p biopsies (confirmed low-grade astrocytoma; on imaging monitor; no resection/RT/Chemo to date) s/p ETV with improvement in headaches for some time. 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 At his worst, reports frequency was  near-daily (27/30 monthly days). He had tried various treatments and has had the most success with Botox PREEMPT protocol since early 2020. He moved to [facility] ~06/2020 and established care with Dr. Annamaria Helling at our [facility] just prior (04/22/2020), where it was decided they would continue Botox for tx of chronic migraine. He formally presented to transition care to myself 01/09/2021, reporting:  FREQUENCY: ~10/30 SEVERITY: 3-4/10 average with spikes to >5/10 on <1-1 days LOCATION: bilateral frontal, sinus, occasional temporal and vertex CHARACTER: pressure-like, throbbing DURATION: a couple of hours ASSOC MIGRAINE SXS: photophobia, phonophobia, osmophobia, nausea (rare), brain fog, lightheadedness, scalp allodynia PRODROME: "a sense that headaches are coming", tingling in the head, disorientation AURA: Denies CRANIAL AUTO SXS: lacrimation, +occasional agitation TRIGGERS: stress, stress let-down, undersleeping, alcohol, bending forward ALLEVIATING FACTORS: distraction ATYPICAL SXS: sometimes numbness on "both sides of face", cheeks; +tinnitus (occasional, brief <5 sec, not always headache-associated, bilateral, high-pitch, non-pulsatile); "periods of vertigo"  There are no positional features to the headaches including change with upright, lying down, valsalva, cough/sneeze; however, bending over is a big trigger for him (->lightheadednes and pain) - chronically so; further, there is no pulsatile tinnitus, diplopia, or TVOs.    Past Medical/Surgical History includes (See problem list):  1. Headaches  2. 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 Burnis Medin), with plans for 1-year follow up MRI (~08/2021)  3. 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 H/o hypertension - "low resting blood pressure" but it spikes up at M.D. offices; does have notable history of HTN in mother  5. IBS - mainly constipation  6. Urinary frequency - has seen Urology for this back in [place].; saw [facility] 01/16/2021 and rec'd Uroflow study for baseline evaluation of suspected pelvic floor dysfunction resulting from high tone and failure to relax during voiding  7. Broken nose s/p surgical repair  8. Sinus problems - "ears pop constantly"  9. H/o mTBIs/concussions - several in life    Current Medications:  Current Outpatient Medications on File Prior to Visit   Medication Sig Dispense Refill    ADVAIR DISKUS 250-50 mcg/dose diskus inhaler 1 puff every 12 (twelve) hours      albuterol 90 mcg/actuation metered dose inhaler Inhale 2 puffs into the lungs daily as needed      cetirizine (ZYRTEC) 10 mg tablet Take 1 tablet (10 mg total) by mouth daily      escitalopram oxalate (LEXAPRO) 10 mg tablet Take 1 tablet (10 mg total) by mouth daily      lisinopriL (PRINIVIL,ZESTRIL) 10 mg tablet Take 10 mg by mouth daily (Patient not taking: Reported on 02/01/2024)      rimegepant (NURTEC ODT) 75 mg rapid dissolve tablet Place 1 tablet (75 mg total) onto the tongue every other day . Limit 1 tablet (75mg )  in 24 hours 16 tablet 2    triamcinolone (NASACORT AQ) 55 mcg nasal spray Use 2 sprays in nostril(s) daily       No current facility-administered medications on file prior to visit.         Prior Abortive Treatments:  -Sumatriptan - no effect, ->"want to throw up"  -Rizatriptan ->throw up  -Ibuprofen (as above)     Prior Preventive Treatments:  -Melatonin  -Nortriptyline   -Gabapentin - initially helpful but headaches returned after 9 months, no SEs  -Propranolol  -Botox (as above)   -Rimegepant (as above)    Prior non-pharmacological and procedural trials:   -none    Pertinent diagnostic testing in the past includes the following:  MRI brain wwo con 02/06/24  IMPRESSION:    1. Compared to 11/17/2021, no evidence of tumor progression. Overall unchanged enhancing nodule centered in the right dorsomedial thalamus/superior colliculus.   2. Redemonstrated foci of abnormal signal in the right globus pallidus, right amygdala, thalami and left caudothalamic groove. Slight differences in signal intensity as detailed above likely reflect differences in technique compared to 11/17/2021. Again, these may reflect underlying dysplastic or hamartomatous lesions.    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 Briskly follows commands.  CN - EOMI, face symmetric, no dysarthria  Motor - No pronator drift, fast finger taps.  Coordination - FNF intact   Gait - Normal narrow base, speedy with normal arm swing.      Assessment:  28 y.o. 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     Recommendations:  As a tertiary referral center and consultative based practice model we feel it is best that the patient works with his/her PCP to implement the following recommendations    For acute treatment continue rimegepant 75mg  ODT at onset of headache, limit 75mg  in 24 hours, limit 10 days per month  For preventive treatment will continue botox (preempt protocol) for chronic migraine given significant reduction in headache frequency and severity of >50% and significant improvement in disability  Future options if headaches increase include resuming rimegepant 75mg  ODT every other day as  prevention, CGRP mabs, nerve blocks, memantine, neuromodulation    Return for next botox (appt already scheduled)  Return for video visit in 6 months    I spent a total of 25 minutes in face-to-face time with the patient, and in non face to face activities, conducted today directly related to this video visit, including reviewing records and tests, obtaining history, performing examination, placing orders, communicating with other healthcare professionals, counseling the patient, family or caregiver, documenting in the medical record, and/or care coordination for the diagnoses above.     Dorothe Pea, M.D.  Associate Clinical Professor  Department of Neurology  Division of [facility] ID# [phone]

## 2024-04-05 NOTE — Telephone Encounter (Signed)
 Left V and sent MyChart message.

## 2024-04-11 NOTE — Telephone Encounter (Signed)
 Left voicemail requesting response to MyChart message.

## 2024-04-25 NOTE — Progress Notes (Deleted)
 [facility]  Procedure Visit     Mr. Seng is a 28 year old patient with a history of inoperable low-grade pilocytic astrocytoma and hydrocephalus (s/o endoscopic ventriculostomy 2014), Chiari 1 malformation, IBS-c, seasonal allergies and chronic migraine without aura and posttraumatic contribution to headaches (hx of several mTBIs/concussions).     He presents for Botox PREEMPT protocol treatment today.   They have tried and failed prevention medications from at least 2-3 different classes. Last Botox was on 02/01/24 with me     Efficacy:  Patient reports that since starting Botox, headache severity improved by more than 50%, frequency by more than 50%, and duration by more than 50%. Overall patient reports that there has been more than 100 hours of improvement in headache frequency.     I reviewed the patient's PMH, FH, and SH as well as current medications and allergies.  Last video visit was with me on 02/22/24 during which the plan was to continue rimegepant for acute treatment and botox for prevention    NEUROLOGIC EXAM  The patient is well appearing and in no acute distress.  Speech is fluent. The face appears symmetric at rest and with excursion. There is no significant atrophy in the frontalis, corrugator, and procerus muscles.  All extremities move symmetrically.  The patient was observed ambulating with normal casual gait and no ataxia.     IMPRESSION  Chronic migraine     We will proceed with onabotulinum toxin A (Botox) injections per the PREEMPT protocol* in order to optimize this patient's treatment plan and improve headache burden, function, and quality of life. The patient meets diagnostic criteria for chronic migraine given the presence of  >/= 15 headache days a month (of which at least 8 are migrainous) for a period of >/= 3 months. Headache episodes have a duration of > 4 hours and are severe in intensity. These episodes are accompanied by migrainous features including one or more of  the following: nausea, vomiting, photophobia, phonophobia, osmophobia, movement sensitivity.  Headaches are disabling. The patient has failed prevention medications in at least 2-3 different classes and trials lasted at least 2-3 months (with the exception of shorter trials due to intolerable side effects).  *The Botox PREEMPT protocol is FDA-approved for chronic migraine and uses 155 units of onabotulinumtoxinA diluted in normal saline, distributed in the following injection sites: bilateral corrugators (10 units, divided in 2 sites), procerus (5 units, in 1 site), frontalis (20 units, divided in 4 sites), bilateral temporalis (40 units, divided in 8 sites), bilateral occipitalis (30 units, divided in 6 sites), bilateral cervical paraspinal muscles (20 units, divided in 6 sites), and the bilateral trapezius (30 units, divided in 6 sites).     PLAN  No planned changes to other headache treatments.       Dx: chronic migraine   OnabotulinumtoxinA Injections (PREEMPT PROTOCOL)    Severity of headaches:  Severe    This patient's headache condition when diagnosed (start of Botox treatment) met criteria for chronic migraine (including >4 hours in duration, and present on 15 or more days per month).      The risks, benefits and anticipated outcomes of the procedure, the risks and benefits of the alternatives to the procedure, and the roles and tasks of the personnel to be involved, were discussed with the patient.  The patient has given written informed consent to the procedure and agrees to proceed: Yes    UNIVERSAL PROTOCOL / SAFETY CHECKLIST    Procedure to be  performed: OnabotulinumtoxinA injection per PREEMPT protocol.    Sign in Communication: Yes    Time Out:  Team Confirms the Correct Patient, Correct Procedure, Correct Site and Site Marking, Correct Position (if applicable), Prep and Dry Time (if applicable).  Time:  3:39 PM    Affirmation of Time Out: Yes    Sign Out Discussion: Yes    Treatment # 19  Diluent:    Normal saline   Indication:  Chronic migraine     Injection SItes    Muscle                         Fixed Site/Fixed Dose                            L      R          Corrugator                   10 Units divided in 2 sites      5   5       Procerus                       5 Units in 1 site               5     Frontalis                       20 Units divided in 4 sites           10 10    Temporalis                   40 Units divided in 8 sites           20 20        Occipitalis                    30 Units divided in 6 sites            15   15        Cervical PSPs             20 Units divided in 4 sites            10   10    Trapezius                     30 Units divided in 6 sites           15   15          Other sites  SCM                                            Rhomboids                       _________  Subtotals                                      155 Units          Total Units used: 155  Total Units wasted:  45  Patient did tolerate procedure.   Change in Treatment Plan? No
